# Patient Record
Sex: Female | Born: 1950 | Race: Asian | Hispanic: No | Marital: Married | Smoking: Never smoker
Health system: Southern US, Community
[De-identification: ages and names within clinical notes are randomized; demographics above are authoritative.]

## PROBLEM LIST (undated history)

## (undated) DIAGNOSIS — E119 Type 2 diabetes mellitus without complications: Secondary | ICD-10-CM

---

## 2016-05-22 ENCOUNTER — Emergency Department (HOSPITAL_COMMUNITY)
Admission: EM | Admit: 2016-05-22 | Discharge: 2016-05-22 | Disposition: A | Discharge status: Home / Self Care | Payer: BLUE CROSS/BLUE SHIELD | Attending: Emergency Medicine | Admitting: Emergency Medicine

## 2016-05-22 ENCOUNTER — Emergency Department (HOSPITAL_COMMUNITY): Payer: BLUE CROSS/BLUE SHIELD

## 2016-05-22 ENCOUNTER — Encounter (HOSPITAL_COMMUNITY): Payer: Self-pay | Admitting: Emergency Medicine

## 2016-05-22 DIAGNOSIS — R102 Pelvic and perineal pain: Secondary | ICD-10-CM

## 2016-05-22 DIAGNOSIS — N390 Urinary tract infection, site not specified: Secondary | ICD-10-CM | POA: Diagnosis not present

## 2016-05-22 DIAGNOSIS — R1030 Lower abdominal pain, unspecified: Secondary | ICD-10-CM | POA: Diagnosis present

## 2016-05-22 LAB — CBC WITH DIFFERENTIAL/PLATELET
BASOS ABS: 0 10*3/uL (ref 0.0–0.1)
BASOS PCT: 0 %
EOS ABS: 0 10*3/uL (ref 0.0–0.7)
Eosinophils Relative: 0 %
HEMATOCRIT: 38.3 % (ref 36.0–46.0)
Hemoglobin: 12.5 g/dL (ref 12.0–15.0)
Lymphocytes Relative: 13 %
Lymphs Abs: 1.1 10*3/uL (ref 0.7–4.0)
MCH: 27 pg (ref 26.0–34.0)
MCHC: 32.6 g/dL (ref 30.0–36.0)
MCV: 82.7 fL (ref 78.0–100.0)
MONOS PCT: 5 %
Monocytes Absolute: 0.4 10*3/uL (ref 0.1–1.0)
Neutro Abs: 6.8 10*3/uL (ref 1.7–7.7)
Neutrophils Relative %: 82 %
Platelets: 261 10*3/uL (ref 150–400)
RBC: 4.63 MIL/uL (ref 3.87–5.11)
RDW: 13.3 % (ref 11.5–15.5)
WBC: 8.4 10*3/uL (ref 4.0–10.5)

## 2016-05-22 LAB — URINALYSIS, ROUTINE W REFLEX MICROSCOPIC
BILIRUBIN URINE: NEGATIVE
Glucose, UA: NEGATIVE mg/dL
Ketones, ur: NEGATIVE mg/dL
NITRITE: NEGATIVE
Protein, ur: 100 mg/dL — AB
SPECIFIC GRAVITY, URINE: 1.013 (ref 1.005–1.030)
pH: 5 (ref 5.0–8.0)

## 2016-05-22 LAB — COMPREHENSIVE METABOLIC PANEL
ALT: 41 U/L (ref 14–54)
AST: 56 U/L — ABNORMAL HIGH (ref 15–41)
Albumin: 3.3 g/dL — ABNORMAL LOW (ref 3.5–5.0)
Alkaline Phosphatase: 87 U/L (ref 38–126)
Anion gap: 10 (ref 5–15)
BILIRUBIN TOTAL: 0.5 mg/dL (ref 0.3–1.2)
BUN: 5 mg/dL — AB (ref 6–20)
CHLORIDE: 99 mmol/L — AB (ref 101–111)
CO2: 25 mmol/L (ref 22–32)
CREATININE: 0.74 mg/dL (ref 0.44–1.00)
Calcium: 8.9 mg/dL (ref 8.9–10.3)
Glucose, Bld: 211 mg/dL — ABNORMAL HIGH (ref 65–99)
Potassium: 3.4 mmol/L — ABNORMAL LOW (ref 3.5–5.1)
Sodium: 134 mmol/L — ABNORMAL LOW (ref 135–145)
TOTAL PROTEIN: 6.7 g/dL (ref 6.5–8.1)

## 2016-05-22 LAB — I-STAT CG4 LACTIC ACID, ED
LACTIC ACID, VENOUS: 2.37 mmol/L — AB (ref 0.5–1.9)
LACTIC ACID, VENOUS: 1.4 mmol/L (ref 0.5–1.9)

## 2016-05-22 MED ORDER — FENTANYL CITRATE (PF) 100 MCG/2ML IJ SOLN
50.0000 ug | Freq: Once | INTRAMUSCULAR | Status: AC
  Administered 2016-05-22: 50 ug via INTRAVENOUS
  Filled 2016-05-22: qty 2

## 2016-05-22 MED ORDER — ONDANSETRON HCL 4 MG/2ML IJ SOLN
4.0000 mg | Freq: Once | INTRAMUSCULAR | Status: AC
  Administered 2016-05-22: 4 mg via INTRAVENOUS
  Filled 2016-05-22: qty 2

## 2016-05-22 MED ORDER — ACETAMINOPHEN 500 MG PO TABS
1000.0000 mg | ORAL_TABLET | Freq: Once | ORAL | Status: AC
  Administered 2016-05-22: 1000 mg via ORAL
  Filled 2016-05-22: qty 2

## 2016-05-22 MED ORDER — IOPAMIDOL (ISOVUE-300) INJECTION 61%
INTRAVENOUS | Status: AC
  Administered 2016-05-22: 80 mL
  Filled 2016-05-22: qty 100

## 2016-05-22 MED ORDER — IOPAMIDOL (ISOVUE-300) INJECTION 61%
INTRAVENOUS | Status: AC
  Administered 2016-05-22: 20:00:00
  Filled 2016-05-22: qty 50

## 2016-05-22 MED ORDER — CEFTRIAXONE SODIUM 1 G IJ SOLR
1.0000 g | Freq: Once | INTRAMUSCULAR | Status: AC
  Administered 2016-05-22: 1 g via INTRAVENOUS
  Filled 2016-05-22: qty 10

## 2016-05-22 MED ORDER — ONDANSETRON 4 MG PO TBDP: 4.0000 mg | ORAL_TABLET | Freq: Three times a day (TID) | ORAL | 0 refills | Status: AC | PRN

## 2016-05-22 MED ORDER — CEPHALEXIN 500 MG PO CAPS: 500.0000 mg | ORAL_CAPSULE | Freq: Three times a day (TID) | ORAL | 0 refills | Status: AC

## 2016-05-22 MED ORDER — SODIUM CHLORIDE 0.9 % IV BOLUS (SEPSIS)
1000.0000 mL | Freq: Once | INTRAVENOUS | Status: AC
  Administered 2016-05-22: 1000 mL via INTRAVENOUS

## 2016-05-22 NOTE — ED Provider Notes (Signed)
[facility]-EMERGENCY DEPT Provider Note   CSN: [id number] Arrival date & time: 05/22/16  1748     History   Chief Complaint Chief Complaint  Patient presents with  . Abdominal Pain  . Fever    HPI Amy Meadows is a 66 y.o. female.  The history is provided by the patient. The history is limited by a language barrier. Language interpreter used: son able to interpret   Abdominal Pain   This is a new problem. Episode onset: 1 week. The problem occurs constantly. The problem has been gradually worsening. The pain is located in the suprapubic region. The pain is moderate. Associated symptoms include fever, diarrhea, nausea, vomiting, dysuria and frequency. Pertinent negatives include hematochezia and melena. Nothing aggravates the symptoms. Nothing relieves the symptoms.  Fever   Associated symptoms include diarrhea and vomiting.    History reviewed. No pertinent past medical history.  There are no active problems to display for this patient.   History reviewed. No pertinent surgical history.  OB History    No data available       Home Medications    Prior to Admission medications   Medication Sig Start Date End Date Taking? Authorizing Provider  phenazopyridine (PYRIDIUM) 97 MG tablet Take 97 mg by mouth 3 (three) times daily as needed for pain.   Yes Historical Provider, MD  cephALEXin (KEFLEX) 500 MG capsule Take 1 capsule (500 mg total) by mouth 3 (three) times daily. 05/22/16 06/01/16  Nira Conn, MD  ondansetron (ZOFRAN ODT) 4 MG disintegrating tablet Take 1 tablet (4 mg total) by mouth every 8 (eight) hours as needed for nausea or vomiting. 05/22/16 05/25/16  Nira Conn, MD    Family History No family history on file.  Social History Social History  Substance Use Topics  . Smoking status: Not on file  . Smokeless tobacco: Not on file  . Alcohol use Not on file     Allergies   Patient has no known allergies.   Review of Systems Review of Systems    Constitutional: Positive for fever.  Gastrointestinal: Positive for abdominal pain, diarrhea, nausea and vomiting. Negative for hematochezia and melena.  Genitourinary: Positive for dysuria and frequency.   Ten systems are reviewed and are negative for acute change except as noted in the HPI   Physical Exam Updated Vital Signs BP 127/70 (BP Location: Right Arm)   Pulse (!) 110   Temp 99.4 F (37.4 C) (Oral)   Resp 17   SpO2 98%   Physical Exam  Constitutional: She is oriented to person, place, and time. She appears well-developed and well-nourished. No distress.  HENT:  Head: Normocephalic and atraumatic.  Nose: Nose normal.  Eyes: Conjunctivae and EOM are normal. Pupils are equal, round, and reactive to light. Right eye exhibits no discharge. Left eye exhibits no discharge. No scleral icterus.  Neck: Normal range of motion. Neck supple.  Cardiovascular: Normal rate and regular rhythm.  Exam reveals no gallop and no friction rub.   No murmur heard. Pulmonary/Chest: Effort normal and breath sounds normal. No stridor. No respiratory distress. She has no rales.  Abdominal: Soft. She exhibits no distension. There is tenderness in the suprapubic area and left lower quadrant. There is no rigidity, no rebound, no guarding and no CVA tenderness.  Musculoskeletal: She exhibits no edema or tenderness.  Neurological: She is alert and oriented to person, place, and time.  Skin: Skin is warm and dry. No rash noted. She is not diaphoretic. No  erythema.  Psychiatric: She has a normal mood and affect.  Vitals reviewed.    ED Treatments / Results  Labs (all labs ordered are listed, but only abnormal results are displayed) Labs Reviewed  COMPREHENSIVE METABOLIC PANEL - Abnormal; Notable for the following:       Result Value   Sodium 134 (*)    Potassium 3.4 (*)    Chloride 99 (*)    Glucose, Bld 211 (*)    BUN 5 (*)    Albumin 3.3 (*)    AST 56 (*)    All other components within  normal limits  URINALYSIS, ROUTINE W REFLEX MICROSCOPIC - Abnormal; Notable for the following:    Color, Urine AMBER (*)    APPearance TURBID (*)    Hgb urine dipstick MODERATE (*)    Protein, ur 100 (*)    Leukocytes, UA LARGE (*)    Bacteria, UA FEW (*)    Squamous Epithelial / LPF 6-30 (*)    Non Squamous Epithelial 0-5 (*)    All other components within normal limits  I-STAT CG4 LACTIC ACID, ED - Abnormal; Notable for the following:    Lactic Acid, Venous 2.37 (*)    All other components within normal limits  URINE CULTURE  CBC WITH DIFFERENTIAL/PLATELET  I-STAT CG4 LACTIC ACID, ED    EKG  EKG Interpretation None       Radiology Ct Abdomen Pelvis W Contrast  Result Date: 05/22/2016 CLINICAL DATA:  66 year old female with abdominal pain, nausea vomiting and diarrhea. EXAM: CT ABDOMEN AND PELVIS WITH CONTRAST TECHNIQUE: Multidetector CT imaging of the abdomen and pelvis was performed using the standard protocol following bolus administration of intravenous contrast. CONTRAST:  80mL ISOVUE-300 IOPAMIDOL (ISOVUE-300) INJECTION 61% COMPARISON:  None. FINDINGS: Lower chest: There bibasilar linear atelectasis. Coronary vascular calcification involving the LAD noted. No intra-abdominal free air or free fluid. Hepatobiliary: Apparent mild fatty infiltration of the liver. No intrahepatic biliary ductal dilatation. The gallbladder is unremarkable. Pancreas: Unremarkable. No pancreatic ductal dilatation or surrounding inflammatory changes. Spleen: Normal in size without focal abnormality. Adrenals/Urinary Tract: The adrenal glands appear unremarkable. There is mild haziness of the periureteric fat at the renal hila as well as mild haziness of the bladder wall. Correlation with urinalysis recommended to exclude UTI. There is no hydronephrosis on either side. There is symmetric uptake and excretion of contrast by kidneys bilaterally. 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 There is  no evidence of bowel obstruction or active inflammation. Normal appendix. Vascular/Lymphatic: Moderate aortoiliac atherosclerotic disease. No aneurysmal dilatation or evidence of dissection. The celiac axis, SMA, and renal arteries appear patent. The SMV, splenic vein, and main portal vein are patent. No portal venous gas identified. There is no adenopathy. Reproductive: The uterus is anteverted and grossly unremarkable. There is minimal prominence of the left ovary without a discrete mass. Ultrasound may provide better evaluation of the pelvic structures if clinically indicated. The right ovary is unremarkable. Other: Small fat containing umbilical hernia. Musculoskeletal: There is mild degenerative changes of the spine with multilevel disc desiccation with vacuum phenomena. No acute fracture. IMPRESSION: 1. No evidence of bowel obstruction or active inflammation. Normal appendix. 2. Mild haziness of the bladder wall and periureteric fat. Correlation with urinalysis recommended to exclude UTI. 3. A small duodenal diverticulum. Electronically Signed   By: Elgie CollardArash  Radparvar M.D.   On: 05/22/2016 23:31    Procedures Procedures (including critical care time)  Medications Ordered in ED Medications  acetaminophen (TYLENOL) tablet  1,000 mg (1,000 mg Oral Given 05/22/16 1951)  fentaNYL (SUBLIMAZE) injection 50 mcg (50 mcg Intravenous Given 05/22/16 1951)  sodium chloride 0.9 % bolus 1,000 mL (0 mLs Intravenous Stopped 05/22/16 2037)  ondansetron (ZOFRAN) injection 4 mg (4 mg Intravenous Given 05/22/16 1951)  iopamidol (ISOVUE-300) 61 % injection (  Contrast Given 05/22/16 2015)  cefTRIAXone (ROCEPHIN) 1 g in dextrose 5 % 50 mL IVPB (0 g Intravenous Stopped 05/22/16 2145)  iopamidol (ISOVUE-300) 61 % injection (80 mLs  Contrast Given 05/22/16 2230)     Initial Impression / Assessment and Plan / ED Course  I have reviewed the triage vital signs and the nursing notes.  Pertinent labs & imaging results that were  available during my care of the patient were reviewed by me and considered in my medical decision making (see chart for details).     Urinary definitive for urinary tract infection given the contamination with squamous cells and lack of nitrite. Given the patient's lower abdominal and left lower quadrant pain CT was obtained to rule out diverticulitis. CT did not reveal GI etiology however did reveal bladder wall thickening which is consistent with urinary tract infection. Patient was provided with first dose of antibiotics in the emergency department. She was able to tolerate by mouth in the ED. Feel she is safe for discharge with strict return precautions.  Final Clinical Impressions(s) / ED Diagnoses   Final diagnoses:  Lower urinary tract infectious disease  Suprapubic pain   Disposition: Discharge  Condition: Good  I have discussed the results, Dx and Tx plan with the patient and familywho expressed understanding and agree(s) with the plan. Discharge instructions discussed at great length. The patient and family were given strict return precautions who verbalized understanding of the instructions. No further questions at time of discharge.    New Prescriptions   CEPHALEXIN (KEFLEX) 500 MG CAPSULE    Take 1 capsule (500 mg total) by mouth 3 (three) times daily.   ONDANSETRON (ZOFRAN ODT) 4 MG DISINTEGRATING TABLET    Take 1 tablet (4 mg total) by mouth every 8 (eight) hours as needed for nausea or vomiting.    Follow Up: Primary care provider  Schedule an appointment as soon as possible for a visit  in 5-7 days, If symptoms do not improve or  worsen      Nira Conn, MD 05/22/16 2337

## 2016-05-22 NOTE — ED Triage Notes (Signed)
edp  Notified of lactic acid

## 2016-05-22 NOTE — ED Triage Notes (Signed)
Pt reports lower abd pain with burning when she urinates for the last 3 days. Pt also reports chills since last night.

## 2016-05-22 NOTE — ED Notes (Signed)
ED Provider at bedside. 

## 2016-05-22 NOTE — ED Triage Notes (Signed)
Lactic acid elevated a2.37

## 2016-05-25 LAB — URINE CULTURE: Culture: >=100000 — AB

## 2016-05-26 ENCOUNTER — Telehealth: Payer: Self-pay | Admitting: Emergency Medicine

## 2016-05-26 NOTE — Telephone Encounter (Signed)
Post ED Visit - Positive Culture Follow-up  Culture report reviewed by antimicrobial stewardship pharmacist:  []  Enzo BiNathan Batchelder, Pharm.D. []  Celedonio MiyamotoJeremy Frens, Pharm.D., BCPS AQ-ID []  Garvin FilaMike Maccia, Pharm.D., BCPS []  Georgina PillionElizabeth Martin, Pharm.D., BCPS []  [place]burgMinh Pham, [address] Rainbow BoulevardPharm.D., BCPS, AAHIVP []  Estella HuskMichelle Turner, Pharm.D., BCPS, AAHIVP []  Lysle Pearlachel Rumbarger, PharmD, BCPS []  Casilda Carlsaylor Stone, PharmD, BCPS []  Pollyann SamplesAndy Johnston, PharmD, BCPS  Positive urine culture Treated with cephalexin, organism sensitive to the same and no further patient follow-up is required at this time.  Berle MullMiller, Alquan Morrish 05/26/2016, 12:28 PM

## 2017-07-25 ENCOUNTER — Encounter (HOSPITAL_COMMUNITY): Payer: Self-pay | Admitting: *Deleted

## 2017-07-25 ENCOUNTER — Other Ambulatory Visit: Payer: Self-pay

## 2017-07-25 ENCOUNTER — Inpatient Hospital Stay (HOSPITAL_COMMUNITY)
Admission: EM | Admit: 2017-07-25 | Discharge: 2017-07-27 | DRG: 392 | Disposition: A | Discharge status: Home / Self Care | Payer: BLUE CROSS/BLUE SHIELD | Attending: Internal Medicine | Admitting: Internal Medicine

## 2017-07-25 ENCOUNTER — Emergency Department (HOSPITAL_COMMUNITY): Payer: BLUE CROSS/BLUE SHIELD

## 2017-07-25 DIAGNOSIS — K529 Noninfective gastroenteritis and colitis, unspecified: Secondary | ICD-10-CM

## 2017-07-25 DIAGNOSIS — E872 Acidosis: Secondary | ICD-10-CM | POA: Diagnosis not present

## 2017-07-25 DIAGNOSIS — K760 Fatty (change of) liver, not elsewhere classified: Secondary | ICD-10-CM | POA: Diagnosis not present

## 2017-07-25 DIAGNOSIS — R109 Unspecified abdominal pain: Secondary | ICD-10-CM | POA: Diagnosis not present

## 2017-07-25 DIAGNOSIS — I959 Hypotension, unspecified: Secondary | ICD-10-CM | POA: Diagnosis present

## 2017-07-25 DIAGNOSIS — N12 Tubulo-interstitial nephritis, not specified as acute or chronic: Secondary | ICD-10-CM | POA: Diagnosis not present

## 2017-07-25 DIAGNOSIS — R197 Diarrhea, unspecified: Secondary | ICD-10-CM

## 2017-07-25 DIAGNOSIS — E860 Dehydration: Secondary | ICD-10-CM | POA: Diagnosis not present

## 2017-07-25 DIAGNOSIS — E861 Hypovolemia: Secondary | ICD-10-CM | POA: Diagnosis not present

## 2017-07-25 DIAGNOSIS — A09 Infectious gastroenteritis and colitis, unspecified: Secondary | ICD-10-CM | POA: Diagnosis not present

## 2017-07-25 DIAGNOSIS — E119 Type 2 diabetes mellitus without complications: Secondary | ICD-10-CM

## 2017-07-25 DIAGNOSIS — I9589 Other hypotension: Secondary | ICD-10-CM | POA: Diagnosis not present

## 2017-07-25 HISTORY — DX: Type 2 diabetes mellitus without complications: E11.9

## 2017-07-25 LAB — URINALYSIS, ROUTINE W REFLEX MICROSCOPIC
GLUCOSE, UA: NEGATIVE mg/dL
Ketones, ur: NEGATIVE mg/dL
NITRITE: NEGATIVE
PH: 5 (ref 5.0–8.0)
PROTEIN: 30 mg/dL — AB
Specific Gravity, Urine: 1.021 (ref 1.005–1.030)

## 2017-07-25 LAB — COMPREHENSIVE METABOLIC PANEL
ALBUMIN: 4 g/dL (ref 3.5–5.0)
ALT: 24 U/L (ref 14–54)
AST: 27 U/L (ref 15–41)
Alkaline Phosphatase: 76 U/L (ref 38–126)
Anion gap: 16 — ABNORMAL HIGH (ref 5–15)
BILIRUBIN TOTAL: 0.7 mg/dL (ref 0.3–1.2)
BUN: 19 mg/dL (ref 6–20)
CALCIUM: 9.4 mg/dL (ref 8.9–10.3)
CO2: 19 mmol/L — ABNORMAL LOW (ref 22–32)
Chloride: 104 mmol/L (ref 101–111)
Creatinine, Ser: 0.99 mg/dL (ref 0.44–1.00)
GFR calc Af Amer: >60 mL/min (ref >60)
GFR, EST NON AFRICAN AMERICAN: 59 mL/min — AB (ref >60)
Glucose, Bld: 169 mg/dL — ABNORMAL HIGH (ref 65–99)
POTASSIUM: 4.2 mmol/L (ref 3.5–5.1)
SODIUM: 139 mmol/L (ref 135–145)
TOTAL PROTEIN: 7.6 g/dL (ref 6.5–8.1)

## 2017-07-25 LAB — CBC
HEMATOCRIT: 50 % — AB (ref 36.0–46.0)
Hemoglobin: 15.6 g/dL — ABNORMAL HIGH (ref 12.0–15.0)
MCH: 26.7 pg (ref 26.0–34.0)
MCHC: 31.2 g/dL (ref 30.0–36.0)
MCV: 85.6 fL (ref 78.0–100.0)
Platelets: 314 10*3/uL (ref 150–400)
RBC: 5.84 MIL/uL — ABNORMAL HIGH (ref 3.87–5.11)
RDW: 14.2 % (ref 11.5–15.5)
WBC: 15.9 10*3/uL — AB (ref 4.0–10.5)

## 2017-07-25 LAB — LIPASE, BLOOD: Lipase: 30 U/L (ref 11–51)

## 2017-07-25 MED ORDER — SODIUM CHLORIDE 0.9 % IV SOLN
1.0000 g | Freq: Once | INTRAVENOUS | Status: AC
  Administered 2017-07-25: 1 g via INTRAVENOUS
  Filled 2017-07-25: qty 10

## 2017-07-25 MED ORDER — ONDANSETRON 4 MG PO TBDP
4.0000 mg | ORAL_TABLET | Freq: Once | ORAL | Status: AC | PRN
  Administered 2017-07-25: 4 mg via ORAL
  Filled 2017-07-25: qty 1

## 2017-07-25 MED ORDER — IOHEXOL 300 MG/ML  SOLN
100.0000 mL | Freq: Once | INTRAMUSCULAR | Status: AC | PRN
  Administered 2017-07-25: 100 mL via INTRAVENOUS

## 2017-07-25 MED ORDER — SODIUM CHLORIDE 0.9 % IV BOLUS
1000.0000 mL | Freq: Once | INTRAVENOUS | Status: AC
  Administered 2017-07-25: 1000 mL via INTRAVENOUS

## 2017-07-25 NOTE — ED Provider Notes (Signed)
[facility] EMERGENCY DEPARTMENT Provider Note   CSN: [id number] Arrival date & time: 07/25/17  1609     History   Chief Complaint Chief Complaint  Patient presents with  . Emesis  . Diarrhea    HPI Amy Meadows is a 67 y.o. female.  The history is provided by the patient. No language interpreter was used.  Emesis   This is a new problem. The current episode started yesterday. The problem has not changed since onset.There has been no fever. Associated symptoms include abdominal pain and diarrhea.  Diarrhea   Associated symptoms include abdominal pain and vomiting.  Abdominal Pain   The current episode started yesterday. The problem occurs constantly. The problem has been gradually worsening. The pain is located in the suprapubic region. Associated symptoms include diarrhea and vomiting. Nothing relieves the symptoms.  Pt has had multiple episodes of diarrhea starting yesterday.  Pt complains of lower abdominal pain.  Pt complains of pain with urination   History reviewed. No pertinent past medical history.  There are no active problems to display for this patient.   History reviewed. No pertinent surgical history.   OB History   None      Home Medications    Prior to Admission medications   Not on File    Family History History reviewed. No pertinent family history.  Social History Social History   Tobacco Use  . Smoking status: Not on file  Substance Use Topics  . Alcohol use: Not on file  . Drug use: Not on file     Allergies   Patient has no known allergies.   Review of Systems Review of Systems  Gastrointestinal: Positive for abdominal pain, diarrhea and vomiting.  All other systems reviewed and are negative.    Physical Exam Updated Vital Signs BP 101/65   Pulse 86   Temp 98 F (36.7 C) (Oral)   Resp (!) 32   SpO2 96%   Physical Exam  Constitutional: She appears well-developed and well-nourished.  HENT:  Head:  Normocephalic.  Eyes: Pupils are equal, round, and reactive to light.  Cardiovascular: Normal rate and regular rhythm.  Pulmonary/Chest: Effort normal and breath sounds normal.  Abdominal: Soft. There is tenderness.  Musculoskeletal: Normal range of motion.  Neurological: She is alert.  Skin: Skin is warm.  Psychiatric: She has a normal mood and affect.  Nursing note and vitals reviewed.    ED Treatments / Results  Labs (all labs ordered are listed, but only abnormal results are displayed) Labs Reviewed  COMPREHENSIVE METABOLIC PANEL - Abnormal; Notable for the following components:      Result Value   CO2 19 (*)    Glucose, Bld 169 (*)    GFR calc non Af Amer 59 (*)    Anion gap 16 (*)    All other components within normal limits  CBC - Abnormal; Notable for the following components:   WBC 15.9 (*)    RBC 5.84 (*)    Hemoglobin 15.6 (*)    HCT 50.0 (*)    All other components within normal limits  URINALYSIS, ROUTINE W REFLEX MICROSCOPIC - Abnormal; Notable for the following components:   Color, Urine AMBER (*)    APPearance CLOUDY (*)    Hgb urine dipstick SMALL (*)    Bilirubin Urine SMALL (*)    Protein, ur 30 (*)    Leukocytes, UA MODERATE (*)    Bacteria, UA MANY (*)    All other  components within normal limits  LIPASE, BLOOD    EKG None  Radiology No results found.  Procedures Procedures (including critical care time)  Medications Ordered in ED Medications  ondansetron (ZOFRAN-ODT) disintegrating tablet 4 mg (4 mg Oral Given 07/25/17 1750)  sodium chloride 0.9 % bolus 1,000 mL (0 mLs Intravenous Stopped 07/25/17 2225)  cefTRIAXone (ROCEPHIN) 1 g in sodium chloride 0.9 % 100 mL IVPB (1 g Intravenous New Bag/Given 07/25/17 2220)  iohexol (OMNIPAQUE) 300 MG/ML solution 100 mL (100 mLs Intravenous Contrast Given 07/25/17 2319)     Initial Impression / Assessment and Plan / ED Course  I have reviewed the triage vital signs and the nursing notes.  Pertinent  labs & imaging results that were available during my care of the patient were reviewed by me and considered in my medical decision making (see chart for details).  Clinical Course as of Jul 26 40  Tue Jul 26, 2017  0033 Chloride: 104 [LS]    Clinical Course User Index [LS] Elson Areas, [place]   Ct scan shows colitis.  No perforation.   MDM  Pt given Iv fluids x 2 liters. Rocephin 1 gram Iv for pyelonephritis.  I spoke to Hospitalist Dr. Joneen Roach who will admit  Final Clinical Impressions(s) / ED Diagnoses   Final diagnoses:  Pyelonephritis  Diarrhea, unspecified type  Colitis    ED Discharge Orders    None       Osie Cheeks 07/26/17 0117    Donnetta Hutching, MD 07/28/17 1003

## 2017-07-25 NOTE — ED Notes (Signed)
Pt speaks [email or web address].language

## 2017-07-25 NOTE — ED Notes (Signed)
Patient transported to CT 

## 2017-07-25 NOTE — ED Notes (Signed)
Due to results and recent vitals, will upgrade patient's acuity and prioritize rooming

## 2017-07-25 NOTE — ED Triage Notes (Signed)
Pt reports recent travel to [place]. Having abd pain with n/v/d since this am.

## 2017-07-26 ENCOUNTER — Encounter (HOSPITAL_COMMUNITY): Payer: Self-pay | Admitting: Family Medicine

## 2017-07-26 DIAGNOSIS — E119 Type 2 diabetes mellitus without complications: Secondary | ICD-10-CM

## 2017-07-26 DIAGNOSIS — A09 Infectious gastroenteritis and colitis, unspecified: Secondary | ICD-10-CM | POA: Diagnosis present

## 2017-07-26 DIAGNOSIS — I9589 Other hypotension: Secondary | ICD-10-CM

## 2017-07-26 DIAGNOSIS — N12 Tubulo-interstitial nephritis, not specified as acute or chronic: Secondary | ICD-10-CM | POA: Diagnosis present

## 2017-07-26 DIAGNOSIS — I959 Hypotension, unspecified: Secondary | ICD-10-CM | POA: Diagnosis present

## 2017-07-26 DIAGNOSIS — E861 Hypovolemia: Secondary | ICD-10-CM

## 2017-07-26 DIAGNOSIS — E860 Dehydration: Secondary | ICD-10-CM | POA: Diagnosis present

## 2017-07-26 DIAGNOSIS — K529 Noninfective gastroenteritis and colitis, unspecified: Secondary | ICD-10-CM | POA: Diagnosis not present

## 2017-07-26 DIAGNOSIS — K760 Fatty (change of) liver, not elsewhere classified: Secondary | ICD-10-CM | POA: Diagnosis present

## 2017-07-26 DIAGNOSIS — E872 Acidosis: Secondary | ICD-10-CM | POA: Diagnosis present

## 2017-07-26 HISTORY — DX: Type 2 diabetes mellitus without complications: E11.9

## 2017-07-26 LAB — CBC WITH DIFFERENTIAL/PLATELET
ABS IMMATURE GRANULOCYTES: 0 10*3/uL (ref 0.0–0.1)
BASOS ABS: 0 10*3/uL (ref 0.0–0.1)
Basophils Relative: 0 %
Eosinophils Absolute: 0.1 10*3/uL (ref 0.0–0.7)
Eosinophils Relative: 1 %
HCT: 37.9 % (ref 36.0–46.0)
Hemoglobin: 12.2 g/dL (ref 12.0–15.0)
Immature Granulocytes: 0 %
Lymphocytes Relative: 16 %
Lymphs Abs: 1.9 10*3/uL (ref 0.7–4.0)
MCH: 27 pg (ref 26.0–34.0)
MCHC: 32.2 g/dL (ref 30.0–36.0)
MCV: 83.8 fL (ref 78.0–100.0)
MONO ABS: 0.7 10*3/uL (ref 0.1–1.0)
Monocytes Relative: 5 %
NEUTROS ABS: 9.3 10*3/uL — AB (ref 1.7–7.7)
Neutrophils Relative %: 78 %
PLATELETS: 256 10*3/uL (ref 150–400)
RBC: 4.52 MIL/uL (ref 3.87–5.11)
RDW: 14.1 % (ref 11.5–15.5)
WBC: 12 10*3/uL — ABNORMAL HIGH (ref 4.0–10.5)

## 2017-07-26 LAB — GASTROINTESTINAL PANEL BY PCR, STOOL (REPLACES STOOL CULTURE)
ADENOVIRUS F40/41: NOT DETECTED
ASTROVIRUS: NOT DETECTED
CAMPYLOBACTER SPECIES: NOT DETECTED
CYCLOSPORA CAYETANENSIS: NOT DETECTED
Cryptosporidium: NOT DETECTED
ENTAMOEBA HISTOLYTICA: NOT DETECTED
ENTEROPATHOGENIC E COLI (EPEC): DETECTED — AB
ENTEROTOXIGENIC E COLI (ETEC): NOT DETECTED
Enteroaggregative E coli (EAEC): DETECTED — AB
Giardia lamblia: NOT DETECTED
Norovirus GI/GII: DETECTED — AB
Plesimonas shigelloides: NOT DETECTED
Rotavirus A: NOT DETECTED
Salmonella species: NOT DETECTED
Sapovirus (I, II, IV, and V): NOT DETECTED
Shiga like toxin producing E coli (STEC): NOT DETECTED
Shigella/Enteroinvasive E coli (EIEC): NOT DETECTED
VIBRIO CHOLERAE: NOT DETECTED
VIBRIO SPECIES: NOT DETECTED
Yersinia enterocolitica: NOT DETECTED

## 2017-07-26 LAB — BASIC METABOLIC PANEL
Anion gap: 9 (ref 5–15)
BUN: 13 mg/dL (ref 6–20)
CALCIUM: 7.3 mg/dL — AB (ref 8.9–10.3)
CO2: 18 mmol/L — ABNORMAL LOW (ref 22–32)
CREATININE: 0.8 mg/dL (ref 0.44–1.00)
Chloride: 113 mmol/L — ABNORMAL HIGH (ref 101–111)
GFR calc Af Amer: >60 mL/min (ref >60)
GLUCOSE: 111 mg/dL — AB (ref 65–99)
Potassium: 3.8 mmol/L (ref 3.5–5.1)
SODIUM: 140 mmol/L (ref 135–145)

## 2017-07-26 LAB — CBG MONITORING, ED
Glucose-Capillary: 110 mg/dL — ABNORMAL HIGH (ref 65–99)
Glucose-Capillary: 113 mg/dL — ABNORMAL HIGH (ref 65–99)
Glucose-Capillary: 95 mg/dL (ref 65–99)
Glucose-Capillary: 80 mg/dL (ref 65–99)
Glucose-Capillary: 99 mg/dL (ref 65–99)

## 2017-07-26 LAB — HIV ANTIBODY (ROUTINE TESTING W REFLEX): HIV SCREEN 4TH GENERATION: NONREACTIVE

## 2017-07-26 LAB — C DIFFICILE QUICK SCREEN W PCR REFLEX
C DIFFICLE (CDIFF) ANTIGEN: NEGATIVE
C Diff interpretation: NOT DETECTED
C Diff toxin: NEGATIVE

## 2017-07-26 LAB — GLUCOSE, CAPILLARY: GLUCOSE-CAPILLARY: 114 mg/dL — AB (ref 65–99)

## 2017-07-26 MED ORDER — ACETAMINOPHEN 650 MG RE SUPP: 650.0000 mg | Freq: Four times a day (QID) | RECTAL | Status: DC | PRN

## 2017-07-26 MED ORDER — SODIUM CHLORIDE 0.9 % IV SOLN
INTRAVENOUS | Status: AC
  Administered 2017-07-26: 02:00:00 via INTRAVENOUS

## 2017-07-26 MED ORDER — INSULIN ASPART 100 UNIT/ML ~~LOC~~ SOLN: 0.0000 [IU] | SUBCUTANEOUS | Status: DC

## 2017-07-26 MED ORDER — SODIUM CHLORIDE 0.9 % IV BOLUS
1000.0000 mL | Freq: Once | INTRAVENOUS | Status: AC
  Administered 2017-07-26: 1000 mL via INTRAVENOUS

## 2017-07-26 MED ORDER — PROMETHAZINE HCL 25 MG/ML IJ SOLN: 12.5000 mg | Freq: Four times a day (QID) | INTRAMUSCULAR | Status: DC | PRN

## 2017-07-26 MED ORDER — FENTANYL CITRATE (PF) 100 MCG/2ML IJ SOLN
25.0000 ug | INTRAMUSCULAR | Status: DC | PRN
  Administered 2017-07-26: 25 ug via INTRAVENOUS
  Filled 2017-07-26: qty 2

## 2017-07-26 MED ORDER — SODIUM CHLORIDE 0.9 % IV SOLN
500.0000 mg | INTRAVENOUS | Status: DC
  Administered 2017-07-26 – 2017-07-27 (×2): 500 mg via INTRAVENOUS
  Filled 2017-07-26 (×2): qty 500

## 2017-07-26 MED ORDER — ACETAMINOPHEN 325 MG PO TABS
650.0000 mg | ORAL_TABLET | Freq: Four times a day (QID) | ORAL | Status: DC | PRN
Start: 2017-07-26 — End: 2017-07-27

## 2017-07-26 MED ORDER — ONDANSETRON HCL 4 MG PO TABS: 4.0000 mg | ORAL_TABLET | Freq: Four times a day (QID) | ORAL | Status: DC | PRN

## 2017-07-26 MED ORDER — FAMOTIDINE IN NACL 20-0.9 MG/50ML-% IV SOLN
20.0000 mg | Freq: Two times a day (BID) | INTRAVENOUS | Status: DC
  Administered 2017-07-26 – 2017-07-27 (×4): 20 mg via INTRAVENOUS
  Filled 2017-07-26 (×4): qty 50

## 2017-07-26 MED ORDER — ONDANSETRON HCL 4 MG/2ML IJ SOLN
4.0000 mg | Freq: Four times a day (QID) | INTRAMUSCULAR | Status: DC | PRN
  Administered 2017-07-26: 4 mg via INTRAVENOUS
  Filled 2017-07-26: qty 2

## 2017-07-26 NOTE — ED Notes (Signed)
Provider at beside, pt received 1000 NS bolus with no change in BP, Dr. Antionette Char aware.

## 2017-07-26 NOTE — ED Notes (Signed)
Kitchen called to get tray sent to pt room upstairs

## 2017-07-26 NOTE — Progress Notes (Signed)
Patient seen and examined at bedside with video interpreter, patient admitted after midnight, please see earlier detailed admission note by Briscoe Deutscher, MD. Briefly, patient presented with abdominal pain, nausea, vomiting, diarrhea.  She is recently returned from [place] earlier this month.  C. difficile panel negative.  GI panel is pending.  Patient's symptoms of nausea, vomiting, diarrhea have improved this morning.  Tolerated breakfast.  Blood pressure has improved to 90s systolic.  We will continue to watch until this afternoon, and if still stable, will transfer to telemetry stepdown.  Follow-up on GI panel.  Continue azithromycin for antibiotic coverage of infection.  Patient communicates in [place] only, no specific dialect.   Jacquelin Hawking, MD [facility] 07/26/2017, 9:09 AM Pager: [phone]

## 2017-07-26 NOTE — ED Notes (Signed)
Full liquid diet lunch tray ordered.

## 2017-07-26 NOTE — H&P (Signed)
History and Physical    Amy Meadows ZOX:[id number] DOB: 1950/10/01 DOA: 07/25/2017  PCP: Patient, No Pcp Per   Patient coming from: Home  Chief Complaint: Abd pain, N/V/D   HPI: Amy Meadows is a 67 y.o. female with medical history significant for hepatic steatosis and diet-controlled diabetes mellitus, now presenting to the emergency department for evaluation of abdominal discomfort with nausea, vomiting, and bloody diarrhea.  Patient's son is at the bedside and assists with the history.  She had reportedly been visiting [place] for a few months, returning earlier this month, and was in her usual state until 07/25/2017 when she developed generalized abdominal discomfort with nausea, several episodes of nonbloody vomiting, and many episodes of bloody diarrhea.  No recent antibiotic use.  She reports chills but has not checked her temperature.  No one at home is sick.  ED Course: Upon arrival to the ED, patient is found to be afebrile, saturating well on room air, with blood pressure of 80/60.  Chemistry panel is notable for mild metabolic acidosis and CBC features a leukocytosis to 15,900 and hemoglobin of 15.6.  Patient was given 2 L normal saline and 1 g of IV Rocephin in the ED.  Blood pressure remains in the 80s systolic.  Fluid resuscitation will be continued and the patient will be admitted to the stepdown unit for ongoing evaluation and management of acute infectious bloody diarrhea.  Review of Systems:  All other systems reviewed and apart from HPI, are negative.  History reviewed. No pertinent past medical history.  History reviewed. No pertinent surgical history.   has no tobacco, alcohol, and drug history on file.  No Known Allergies  History reviewed. No pertinent family history.   Prior to Admission medications   Not on File    Physical Exam: Vitals:   07/26/17 0047 07/26/17 0054 07/26/17 0057 07/26/17 0113  BP:    (!) 88/67  Pulse: 79 78 79 75  Resp: (!) 29 (!) 23 (!) 25 (!) 23    Temp:      TempSrc:      SpO2: 94% 97% 93% 94%      Constitutional: NAD, calm, appears uncomfortable Eyes: PERTLA, lids and conjunctivae normal ENMT: Mucous membranes are moist. Posterior pharynx clear of any exudate or lesions.   Neck: normal, supple, no masses, no thyromegaly Respiratory: clear to auscultation bilaterally, no wheezing, no crackles. Normal respiratory effort.    Cardiovascular: S1 & S2 heard, regular rate and rhythm. No extremity edema. No significant JVD. Abdomen: No distension, soft, generalized tenderness without rebound pain or guarding. Bowel sounds active.  Musculoskeletal: no clubbing / cyanosis. No joint deformity upper and lower extremities.   Skin: no significant rashes, lesions, ulcers. Warm, dry, well-perfused. Neurologic: CN 2-12 grossly intact. Sensation intact. Strength 5/5 in all 4 limbs.  Psychiatric: Lethargic, easily roused. Calm and cooperative.     Labs on Admission: I have personally reviewed following labs and imaging studies  CBC: Recent Labs  Lab 07/25/17 1804  WBC 15.9*  HGB 15.6*  HCT 50.0*  MCV 85.6  PLT 314   Basic Metabolic Panel: Recent Labs  Lab 07/25/17 1804  NA 139  K 4.2  CL 104  CO2 19*  GLUCOSE 169*  BUN 19  CREATININE 0.99  CALCIUM 9.4   GFR: CrCl cannot be calculated (Unknown ideal weight.). Liver Function Tests: Recent Labs  Lab 07/25/17 1804  AST 27  ALT 24  ALKPHOS 76  BILITOT 0.7  PROT 7.6  ALBUMIN 4.0  Recent Labs  Lab 07/25/17 1804  LIPASE 30   No results for input(s): AMMONIA in the last 168 hours. Coagulation Profile: No results for input(s): INR, PROTIME in the last 168 hours. Cardiac Enzymes: No results for input(s): CKTOTAL, CKMB, CKMBINDEX, TROPONINI in the last 168 hours. BNP (last 3 results) No results for input(s): PROBNP in the last 8760 hours. HbA1C: No results for input(s): HGBA1C in the last 72 hours. CBG: No results for input(s): GLUCAP in the last 168  hours. Lipid Profile: No results for input(s): CHOL, HDL, LDLCALC, TRIG, CHOLHDL, LDLDIRECT in the last 72 hours. Thyroid Function Tests: No results for input(s): TSH, T4TOTAL, FREET4, T3FREE, THYROIDAB in the last 72 hours. Anemia Panel: No results for input(s): VITAMINB12, FOLATE, FERRITIN, TIBC, IRON, RETICCTPCT in the last 72 hours. Urine analysis:    Component Value Date/Time   COLORURINE AMBER (A) 07/25/2017 1746   APPEARANCEUR CLOUDY (A) 07/25/2017 1746   LABSPEC 1.021 07/25/2017 1746   PHURINE 5.0 07/25/2017 1746   GLUCOSEU NEGATIVE 07/25/2017 1746   HGBUR SMALL (A) 07/25/2017 1746   BILIRUBINUR SMALL (A) 07/25/2017 1746   KETONESUR NEGATIVE 07/25/2017 1746   PROTEINUR 30 (A) 07/25/2017 1746   NITRITE NEGATIVE 07/25/2017 1746   LEUKOCYTESUR MODERATE (A) 07/25/2017 1746   Sepsis Labs: (procalcitonin:4,lacticidven:4) )No results found for this or any previous visit (from the past 240 hour(s)).   Radiological Exams on Admission: Ct Abdomen Pelvis W Contrast  Result Date: 07/26/2017 CLINICAL DATA:  Acute generalized abdominal pain. EXAM: CT ABDOMEN AND PELVIS WITH CONTRAST TECHNIQUE: Multidetector CT imaging of the abdomen and pelvis was performed using the standard protocol following bolus administration of intravenous contrast. CONTRAST:  OMNIPAQUE IOHEXOL 300 MG/ML  SOLN COMPARISON:  CT 05/22/2016 FINDINGS: Lower chest: Scattered lung base atelectasis. Hepatobiliary: Mild motion artifact limitations. No focal hepatic lesion. Gallbladder physiologically distended, no calcified stone. No biliary dilatation. Pancreas: No ductal dilatation or inflammation. Spleen: Normal in size without focal abnormality. Small splenule medially. Adrenals/Urinary Tract: Normal adrenal glands. No hydronephrosis or perinephric edema. Homogeneous renal enhancement with symmetric excretion on delayed phase imaging. Urinary bladder is partially distended, equivocal wall thickening felt to  be secondary to degree of distension. Stomach/Bowel: Stomach is nondistended. Small duodenum diverticulum without inflammation. Pelvic small bowel loops are fluid-filled but normal in caliber. Minimal mesenteric edema of the lower mesentery. Fluid within the colon without definite colonic wall thickening. Equivocal colonic wall hyperemia. No appendicitis. Vascular/Lymphatic: Aortic atherosclerosis and tortuosity. No aneurysm. Dilatation of the left ovarian vein measuring 10 mm, mild prominence of right ovarian veins. No adenopathy. Reproductive: Mildly prominent uterine vascularity. No adnexal mass. Other: No free air, free fluid, or intra-abdominal fluid collection. Musculoskeletal: There are no acute or suspicious osseous abnormalities. IMPRESSION: 1. Fluid-filled distal small bowel and colon with mild mucosal hyperemia and mesenteric edema, suggesting enteric colitis/colitis, may be infectious or inflammatory. 2. Prominent ovarian veins and uterine vascularity, can be seen with pelvic congestion syndrome. 3.  Aortic Atherosclerosis (ICD10-I70.0). Electronically Signed   By: Rubye Oaks M.D.   On: 07/26/2017 00:12    EKG: Not performed.   Assessment/Plan   1. Acute infectious diarrhea  - Presents with 1 day of abdominal pain with nausea, vomiting, and bloody diarrhea; diarrhea is the main complaint  - She returned from a visit to [place] earlier this month  - CT findings suggest colitis, no abscess or perforation  - Continue fluid-resuscitation, check stool studies, maintain enteric precautions, start azithromycin, monitor electrolytes    2.  Hypotension  - SBP low 80's in ED  - Likely secondary to hypovolemia in setting of vomiting and diarrhea  - Continue fluid-resuscitation with IVF    3. Diet-controlled DM  - No A1c on file  - Monitor CBG and use low-intensity SSI with Novolog as needed     DVT prophylaxis: SCD's  Code Status: Full  Family Communication: Son updated at  bedside Consults called: None Admission status: Inpatient    Briscoe Deutscher, MD [facility] Pager [phone]  If 7PM-7AM, please contact night-coverage [email or web address] Password [email or web address]  07/26/2017, 1:30 AM

## 2017-07-26 NOTE — ED Notes (Signed)
Full liquid diet dinner tray ordered

## 2017-07-27 DIAGNOSIS — K529 Noninfective gastroenteritis and colitis, unspecified: Secondary | ICD-10-CM

## 2017-07-27 DIAGNOSIS — A09 Infectious gastroenteritis and colitis, unspecified: Principal | ICD-10-CM

## 2017-07-27 LAB — GLUCOSE, CAPILLARY
GLUCOSE-CAPILLARY: 116 mg/dL — AB (ref 65–99)
GLUCOSE-CAPILLARY: 90 mg/dL (ref 65–99)
Glucose-Capillary: 102 mg/dL — ABNORMAL HIGH (ref 65–99)
Glucose-Capillary: 105 mg/dL — ABNORMAL HIGH (ref 65–99)

## 2017-07-27 MED ORDER — SULFAMETHOXAZOLE-TRIMETHOPRIM 800-160 MG PO TABS: 1.0000 | ORAL_TABLET | Freq: Two times a day (BID) | ORAL | 0 refills | Status: AC

## 2017-07-27 NOTE — Discharge Instructions (Signed)
Follow with Primary MD in 7 days   Get CBC, CMP Checked  by Primary MD next visit.    Activity: As tolerated with Full fall precautions use walker/cane & assistance as needed   Disposition Home    Diet: Carb modified diet , with feeding assistance and aspiration precautions.  For Heart failure patients - Check your Weight same time everyday, if you gain over 2 pounds, or you develop in leg swelling, experience more shortness of breath or chest pain, call your Primary MD immediately. Follow Cardiac Low Salt Diet and 1.5 lit/day fluid restriction.   On your next visit with your primary care physician please Get Medicines reviewed and adjusted.   Please request your Prim.MD to go over all Hospital Tests and Procedure/Radiological results at the follow up, please get all Hospital records sent to your Prim MD by signing hospital release before you go home.   If you experience worsening of your admission symptoms, develop shortness of breath, life threatening emergency, suicidal or homicidal thoughts you must seek medical attention immediately by calling 911 or calling your MD immediately  if symptoms less severe.  You Must read complete instructions/literature along with all the possible adverse reactions/side effects for all the Medicines you take and that have been prescribed to you. Take any new Medicines after you have completely understood and accpet all the possible adverse reactions/side effects.   Do not drive, operating heavy machinery, perform activities at heights, swimming or participation in water activities or provide baby sitting services if your were admitted for syncope or siezures until you have seen by Primary MD or a Neurologist and advised to do so again.  Do not drive when taking Pain medications.    Do not take more than prescribed Pain, Sleep and Anxiety Medications  Special Instructions: If you have smoked or chewed Tobacco  in the last 2 yrs please stop smoking,  stop any regular Alcohol  and or any Recreational drug use.  Wear Seat belts while driving.   Please note  You were cared for by a hospitalist during your hospital stay. If you have any questions about your discharge medications or the care you received while you were in the hospital after you are discharged, you can call the unit and asked to speak with the hospitalist on call if the hospitalist that took care of you is not available. Once you are discharged, your primary care physician will handle any further medical issues. Please note that NO REFILLS for any discharge medications will be authorized once you are discharged, as it is imperative that you return to your primary care physician (or establish a relationship with a primary care physician if you do not have one) for your aftercare needs so that they can reassess your need for medications and monitor your lab values.

## 2017-07-27 NOTE — Discharge Summary (Signed)
Amy Meadows, is a 67 y.o. female  DOB [birth year]  MRN [id number].  Admission date:  07/25/2017  Admitting Physician  Briscoe Deutscher, MD  Discharge Date:  07/27/2017   Primary MD  Patient, No Pcp Per  Recommendations for primary care physician for things to follow:  -Check CBC, BMP during next visit   Admission Diagnosis  Dehydration [E86.0] Colitis [K52.9] Pyelonephritis [N12] Hypotension, unspecified hypotension type [I95.9] Diarrhea, unspecified type [R19.7]   Discharge Diagnosis  Dehydration [E86.0] Colitis [K52.9] Pyelonephritis [N12] Hypotension, unspecified hypotension type [I95.9] Diarrhea, unspecified type [R19.7]    Principal Problem:   Acute infectious diarrhea Active Problems:   Hypotension (arterial)   Diet-controlled diabetes mellitus (HCC)      Past Medical History:  Diagnosis Date  . Diet-controlled diabetes mellitus (HCC) 07/26/2017    History reviewed. No pertinent surgical history.     History of present illness and  Hospital Course:     Kindly see H&P for history of present illness and admission details, please review complete Labs, Consult reports and Test reports for all details in brief  HPI  from the history and physical done on the day of admission HPI: Amy Meadows is a 67 y.o. female with medical history significant for hepatic steatosis and diet-controlled diabetes mellitus, now presenting to the emergency department for evaluation of abdominal discomfort with nausea, vomiting, and bloody diarrhea.  Patient's son is at the bedside and assists with the history.  She had reportedly been visiting [place] for a few months, returning earlier this month, and was in her usual state until 07/25/2017 when she developed generalized abdominal discomfort with nausea, several episodes of nonbloody vomiting, and many episodes of bloody diarrhea.  No recent antibiotic use.  She reports  chills but has not checked her temperature.  No one at home is sick.  ED Course: Upon arrival to the ED, patient is found to be afebrile, saturating well on room air, with blood pressure of 80/60.  Chemistry panel is notable for mild metabolic acidosis and CBC features a leukocytosis to 15,900 and hemoglobin of 15.6.  Patient was given 2 L normal saline and 1 g of IV Rocephin in the ED.  Blood pressure remains in the 80s systolic.  Fluid resuscitation will be continued and the patient will be admitted to the stepdown unit for ongoing evaluation and management of acute infectious bloody diarrhea.  Hospital course  1.Acute infectious diarrhea /E. coli colitis - Presents with 1 day of abdominal pain with nausea, vomiting, and bloody diarrhea; diarrhea is the main complaint , CT findings suggest colitis, but no abscess or perforation, she was started on azithromycin, and her diet was advanced gradually, no nausea, no vomiting, her diarrhea has significantly subsided, she had only 2 small bowel movements overnight, nonbloody anymore, stool panel came back significant for neurovirus, and E. coli, she will be discharged on total 1 week of Bactrim.  2. Hypotension  - SBP low 80's in ED  - Likely secondary to hypovolemia in setting of vomiting  and diarrhea  - Improved with IV fluids, she denies any further nausea or vomiting, she reports good appetite with good oral and fluid intake  3. Diet-controlled DM  - No A1c on file , was kept on insulin sliding scale during hospital stay, with good control of CBGs and almost no insulin requirement       Discharge Condition:  Stable   Follow UP      Discharge Instructions  and  Discharge Medications     Discharge Instructions    Discharge instructions   Complete by:  As directed    Follow with Primary MD in 7 days   Get CBC, CMP Checked  by Primary MD next visit.    Activity: As tolerated with Full fall precautions use walker/cane &  assistance as needed   Disposition Home    Diet: Carb modified diet , with feeding assistance and aspiration precautions.  For Heart failure patients - Check your Weight same time everyday, if you gain over 2 pounds, or you develop in leg swelling, experience more shortness of breath or chest pain, call your Primary MD immediately. Follow Cardiac Low Salt Diet and 1.5 lit/day fluid restriction.   On your next visit with your primary care physician please Get Medicines reviewed and adjusted.   Please request your Prim.MD to go over all Hospital Tests and Procedure/Radiological results at the follow up, please get all Hospital records sent to your Prim MD by signing hospital release before you go home.   If you experience worsening of your admission symptoms, develop shortness of breath, life threatening emergency, suicidal or homicidal thoughts you must seek medical attention immediately by calling 911 or calling your MD immediately  if symptoms less severe.  You Must read complete instructions/literature along with all the possible adverse reactions/side effects for all the Medicines you take and that have been prescribed to you. Take any new Medicines after you have completely understood and accpet all the possible adverse reactions/side effects.   Do not drive, operating heavy machinery, perform activities at heights, swimming or participation in water activities or provide baby sitting services if your were admitted for syncope or siezures until you have seen by Primary MD or a Neurologist and advised to do so again.  Do not drive when taking Pain medications.    Do not take more than prescribed Pain, Sleep and Anxiety Medications  Special Instructions: If you have smoked or chewed Tobacco  in the last 2 yrs please stop smoking, stop any regular Alcohol  and or any Recreational drug use.  Wear Seat belts while driving.   Please note  You were cared for by a hospitalist during  your hospital stay. If you have any questions about your discharge medications or the care you received while you were in the hospital after you are discharged, you can call the unit and asked to speak with the hospitalist on call if the hospitalist that took care of you is not available. Once you are discharged, your primary care physician will handle any further medical issues. Please note that NO REFILLS for any discharge medications will be authorized once you are discharged, as it is imperative that you return to your primary care physician (or establish a relationship with a primary care physician if you do not have one) for your aftercare needs so that they can reassess your need for medications and monitor your lab values.   Increase activity slowly   Complete by:  As directed  Allergies as of 07/27/2017   No Known Allergies     Medication List    TAKE these medications   sulfamethoxazole-trimethoprim 800-160 MG tablet Commonly known as:  BACTRIM DS,SEPTRA DS Take 1 tablet by mouth 2 (two) times daily.         Diet and Activity recommendation: See Discharge Instructions above   Consults obtained - none   Major procedures and Radiology Reports - PLEASE review detailed and final reports for all details, in brief -     Ct Abdomen Pelvis W Contrast  Result Date: 07/26/2017 CLINICAL DATA:  Acute generalized abdominal pain. EXAM: CT ABDOMEN AND PELVIS WITH CONTRAST TECHNIQUE: Multidetector CT imaging of the abdomen and pelvis was performed using the standard protocol following bolus administration of intravenous contrast. CONTRAST:  OMNIPAQUE IOHEXOL 300 MG/ML  SOLN COMPARISON:  CT 05/22/2016 FINDINGS: Lower chest: Scattered lung base atelectasis. Hepatobiliary: Mild motion artifact limitations. No focal hepatic lesion. Gallbladder physiologically distended, no calcified stone. No biliary dilatation. Pancreas: No ductal dilatation or inflammation. Spleen: Normal in size  without focal abnormality. Small splenule medially. Adrenals/Urinary Tract: Normal adrenal glands. No hydronephrosis or perinephric edema. Homogeneous renal enhancement with symmetric excretion on delayed phase imaging. Urinary bladder is partially distended, equivocal wall thickening felt to be secondary to degree of distension. Stomach/Bowel: Stomach is nondistended. Small duodenum diverticulum without inflammation. Pelvic small bowel loops are fluid-filled but normal in caliber. Minimal mesenteric edema of the lower mesentery. Fluid within the colon without definite colonic wall thickening. Equivocal colonic wall hyperemia. No appendicitis. Vascular/Lymphatic: Aortic atherosclerosis and tortuosity. No aneurysm. Dilatation of the left ovarian vein measuring 10 mm, mild prominence of right ovarian veins. No adenopathy. Reproductive: Mildly prominent uterine vascularity. No adnexal mass. Other: No free air, free fluid, or intra-abdominal fluid collection. Musculoskeletal: There are no acute or suspicious osseous abnormalities. IMPRESSION: 1. Fluid-filled distal small bowel and colon with mild mucosal hyperemia and mesenteric edema, suggesting enteric colitis/colitis, may be infectious or inflammatory. 2. Prominent ovarian veins and uterine vascularity, can be seen with pelvic congestion syndrome. 3.  Aortic Atherosclerosis (ICD10-I70.0). Electronically Signed   By: Rubye Oaks M.D.   On: 07/26/2017 00:12    Micro Results    Recent Results (from the past 240 hour(s))  C difficile quick scan w PCR reflex     Status: None   Collection Time: 07/26/17  4:38 AM  Result Value Ref Range Status   C Diff antigen NEGATIVE NEGATIVE Final   C Diff toxin NEGATIVE NEGATIVE Final   C Diff interpretation No C. difficile detected.  Final    Comment: Performed at [facility], [address] [place], [place] [address]  Gastrointestinal Panel by PCR , Stool     Status: Abnormal   Collection Time: 07/26/17   4:38 AM  Result Value Ref Range Status   Campylobacter species NOT DETECTED NOT DETECTED Final   Plesimonas shigelloides NOT DETECTED NOT DETECTED Final   Salmonella species NOT DETECTED NOT DETECTED Final   Yersinia enterocolitica NOT DETECTED NOT DETECTED Final   Vibrio species NOT DETECTED NOT DETECTED Final   Vibrio cholerae NOT DETECTED NOT DETECTED Final   Enteroaggregative E coli (EAEC) DETECTED (A) NOT DETECTED Final    Comment: RESULT CALLED TO, READ BACK BY AND VERIFIED WITH: [place] GROSE  07/26/17 AKT    Enteropathogenic E coli (EPEC) DETECTED (A) NOT DETECTED Final    Comment: RESULT CALLED TO, READ BACK BY AND VERIFIED WITH: [place] GROSE  07/26/17 AKT  Enterotoxigenic E coli (ETEC) NOT DETECTED NOT DETECTED Final   Shiga like toxin producing E coli (STEC) NOT DETECTED NOT DETECTED Final   Shigella/Enteroinvasive E coli (EIEC) NOT DETECTED NOT DETECTED Final   Cryptosporidium NOT DETECTED NOT DETECTED Final   Cyclospora cayetanensis NOT DETECTED NOT DETECTED Final   Entamoeba histolytica NOT DETECTED NOT DETECTED Final   Giardia lamblia NOT DETECTED NOT DETECTED Final   Adenovirus F40/41 NOT DETECTED NOT DETECTED Final   Astrovirus NOT DETECTED NOT DETECTED Final   Norovirus GI/GII DETECTED (A) NOT DETECTED Final    Comment: RESULT CALLED TO, READ BACK BY AND VERIFIED WITH: [place] GROSE  07/26/17 AKT    Rotavirus A NOT DETECTED NOT DETECTED Final   Sapovirus (I, II, IV, and V) NOT DETECTED NOT DETECTED Final    Comment: Performed at [facility], [address], [place], [place] [address]       Today   Subjective:   Amy Meadows today has no headache,no chest or abdominal pain, is tolerating oral intake, no nausea, no vomiting, only 2 small loose bowel movement overnight Objective:   Blood pressure 102/87, pulse 60, temperature 98.8 F (37.1 C), temperature source Oral, resp. rate (!) 24, weight 66 kg (145 lb 8.1 oz), SpO2 97 %.  No intake or  output data in the 24 hours ending 07/27/17 1429  Exam Awake Alert, Oriented x 3, No new F.N deficits, Normal affect Symmetrical Chest wall movement, Good air movement bilaterally, CTAB RRR,No Gallops,Rubs or new Murmurs, No Parasternal Heave +ve B.Sounds, Abd Soft, Non tender,  No rebound -guarding or rigidity. No Cyanosis, Clubbing or edema, No new Rash or bruise   Online interpreter has been used during physical exam evaluation  Data Review   CBC w Diff:  Lab Results  Component Value Date   WBC 12.0 (H) 07/26/2017   HGB 12.2 07/26/2017   HCT 37.9 07/26/2017   PLT 256 07/26/2017   LYMPHOPCT 16 07/26/2017   MONOPCT 5 07/26/2017   EOSPCT 1 07/26/2017   BASOPCT 0 07/26/2017    CMP:  Lab Results  Component Value Date   NA 140 07/26/2017   K 3.8 07/26/2017   CL 113 (H) 07/26/2017   CO2 18 (L) 07/26/2017   BUN 13 07/26/2017   CREATININE 0.80 07/26/2017   PROT 7.6 07/25/2017   ALBUMIN 4.0 07/25/2017   BILITOT 0.7 07/25/2017   ALKPHOS 76 07/25/2017   AST 27 07/25/2017   ALT 24 07/25/2017  .   Total Time in preparing paper work, data evaluation and todays exam - 28 minutes  Huey Bienenstock M.D on 07/27/2017 at 2:29 PM  [facility]   Office  [phone]

## 2017-09-04 DIAGNOSIS — R82998 Other abnormal findings in urine: Secondary | ICD-10-CM | POA: Diagnosis not present

## 2017-09-04 DIAGNOSIS — R300 Dysuria: Secondary | ICD-10-CM | POA: Diagnosis not present

## 2017-10-29 DIAGNOSIS — E119 Type 2 diabetes mellitus without complications: Secondary | ICD-10-CM | POA: Diagnosis not present

## 2017-10-29 DIAGNOSIS — N390 Urinary tract infection, site not specified: Secondary | ICD-10-CM | POA: Diagnosis not present

## 2017-10-29 DIAGNOSIS — R309 Painful micturition, unspecified: Secondary | ICD-10-CM | POA: Diagnosis not present

## 2017-11-01 ENCOUNTER — Other Ambulatory Visit: Payer: Self-pay

## 2017-11-01 ENCOUNTER — Emergency Department (HOSPITAL_COMMUNITY)
Admission: EM | Admit: 2017-11-01 | Discharge: 2017-11-02 | Disposition: A | Discharge status: Home / Self Care | Payer: BLUE CROSS/BLUE SHIELD | Attending: Emergency Medicine | Admitting: Emergency Medicine

## 2017-11-01 ENCOUNTER — Encounter (HOSPITAL_COMMUNITY): Payer: Self-pay | Admitting: Emergency Medicine

## 2017-11-01 DIAGNOSIS — R509 Fever, unspecified: Secondary | ICD-10-CM | POA: Diagnosis not present

## 2017-11-01 DIAGNOSIS — E119 Type 2 diabetes mellitus without complications: Secondary | ICD-10-CM | POA: Insufficient documentation

## 2017-11-01 DIAGNOSIS — N390 Urinary tract infection, site not specified: Secondary | ICD-10-CM | POA: Diagnosis not present

## 2017-11-01 DIAGNOSIS — R110 Nausea: Secondary | ICD-10-CM | POA: Diagnosis not present

## 2017-11-01 DIAGNOSIS — R1030 Lower abdominal pain, unspecified: Secondary | ICD-10-CM | POA: Insufficient documentation

## 2017-11-01 DIAGNOSIS — R300 Dysuria: Secondary | ICD-10-CM | POA: Diagnosis not present

## 2017-11-01 LAB — URINALYSIS, ROUTINE W REFLEX MICROSCOPIC
BILIRUBIN URINE: NEGATIVE
GLUCOSE, UA: NEGATIVE mg/dL
HGB URINE DIPSTICK: NEGATIVE
Ketones, ur: NEGATIVE mg/dL
Leukocytes, UA: NEGATIVE
Nitrite: NEGATIVE
Protein, ur: NEGATIVE mg/dL
Specific Gravity, Urine: 1.004 — ABNORMAL LOW (ref 1.005–1.030)
pH: 6 (ref 5.0–8.0)

## 2017-11-01 NOTE — ED Triage Notes (Signed)
[place] interpreter used for triage. Pt reports decrease urinary frequency, burning with urination, and feels like she isn't urinating enough. States she has episodes of chills at home, afebrile in triage. Pt saw her PCP 3 days ago for UTI and is currently taking bactrim. But reports its not getting better.

## 2017-11-01 NOTE — ED Notes (Signed)
Need translator. Translator in room. Language [place].

## 2017-11-02 LAB — CBC WITH DIFFERENTIAL/PLATELET
Abs Immature Granulocytes: 0 10*3/uL (ref 0.0–0.1)
BASOS ABS: 0.1 10*3/uL (ref 0.0–0.1)
Basophils Relative: 1 %
Eosinophils Absolute: 0.1 10*3/uL (ref 0.0–0.7)
Eosinophils Relative: 2 %
HCT: 42.2 % (ref 36.0–46.0)
Hemoglobin: 13.4 g/dL (ref 12.0–15.0)
Immature Granulocytes: 0 %
Lymphocytes Relative: 49 %
Lymphs Abs: 3 10*3/uL (ref 0.7–4.0)
MCH: 27.5 pg (ref 26.0–34.0)
MCHC: 31.8 g/dL (ref 30.0–36.0)
MCV: 86.5 fL (ref 78.0–100.0)
MONO ABS: 0.5 10*3/uL (ref 0.1–1.0)
MONOS PCT: 7 %
Neutro Abs: 2.5 10*3/uL (ref 1.7–7.7)
Neutrophils Relative %: 41 %
Platelets: 243 10*3/uL (ref 150–400)
RBC: 4.88 MIL/uL (ref 3.87–5.11)
RDW: 11.9 % (ref 11.5–15.5)
WBC: 6.2 10*3/uL (ref 4.0–10.5)

## 2017-11-02 LAB — LACTIC ACID, PLASMA: Lactic Acid, Venous: 1.4 mmol/L (ref 0.5–1.9)

## 2017-11-02 LAB — COMPREHENSIVE METABOLIC PANEL
ALBUMIN: 3.7 g/dL (ref 3.5–5.0)
ALT: 20 U/L (ref 0–44)
AST: 22 U/L (ref 15–41)
Alkaline Phosphatase: 69 U/L (ref 38–126)
Anion gap: 9 (ref 5–15)
BUN: 9 mg/dL (ref 8–23)
CALCIUM: 9.3 mg/dL (ref 8.9–10.3)
CO2: 22 mmol/L (ref 22–32)
Chloride: 107 mmol/L (ref 98–111)
Creatinine, Ser: 0.92 mg/dL (ref 0.44–1.00)
GFR calc non Af Amer: >60 mL/min (ref >60)
GLUCOSE: 98 mg/dL (ref 70–99)
Potassium: 4.1 mmol/L (ref 3.5–5.1)
SODIUM: 138 mmol/L (ref 135–145)
Total Bilirubin: 0.8 mg/dL (ref 0.3–1.2)
Total Protein: 7 g/dL (ref 6.5–8.1)

## 2017-11-02 MED ORDER — PHENAZOPYRIDINE HCL 100 MG PO TABS
200.0000 mg | ORAL_TABLET | Freq: Once | ORAL | Status: AC
  Administered 2017-11-02: 200 mg via ORAL
  Filled 2017-11-02: qty 2

## 2017-11-02 MED ORDER — ACETAMINOPHEN 325 MG PO TABS
650.0000 mg | ORAL_TABLET | Freq: Once | ORAL | Status: AC
  Administered 2017-11-02: 650 mg via ORAL
  Filled 2017-11-02: qty 2

## 2017-11-02 MED ORDER — PHENAZOPYRIDINE HCL 200 MG PO TABS: 200.0000 mg | ORAL_TABLET | Freq: Three times a day (TID) | ORAL | 0 refills | Status: AC

## 2017-11-02 NOTE — ED Provider Notes (Signed)
[facility] Provider Note   CSN: [id number] Arrival date & time: 11/01/17  2206     History   Chief Complaint Chief Complaint  Patient presents with  . Urinary Tract Infection    HPI Amy Dedra SkeensKa is a 67 y.o. female.  Patient presents with family for evaluation of persistent symptoms of suprapubic and vaginal pain associated with urination. She was seen [facility] at [facility] for symptoms of diarrhea, suprapubic pain, dysuria and discharged home on Septra DS for UTI. Per son, she felt better on day 1 but symptoms returned except diarrhea and have been persistent. She reports nausea without vomiting. Per son, she is not eating or drinking per her usual. No flank pain or back pain. No chest pain, SOB, cough.   The history is provided by a relative. A language interpreter was used Garment/textile technologist(Interpreter is son at bedside).  Urinary Tract Infection   Associated symptoms include chills and nausea. Pertinent negatives include no vomiting and no flank pain.    Past Medical History:  Diagnosis Date  . Diet-controlled diabetes mellitus (HCC) 07/26/2017    Patient Active Problem List   Diagnosis Date Noted  . Acute infectious diarrhea 07/26/2017  . Hypotension (arterial) 07/26/2017  . Diet-controlled diabetes mellitus (HCC) 07/26/2017    History reviewed. No pertinent surgical history.   OB History   None      Home Medications    Prior to Admission medications   Medication Sig Start Date End Date Taking? Authorizing Provider  sulfamethoxazole-trimethoprim (BACTRIM DS,SEPTRA DS) 800-160 MG tablet Take 1 tablet by mouth 2 (two) times daily. 07/27/17   Elgergawy, Leana Roeawood S, MD    Family History No family history on file.  Social History Social History   Tobacco Use  . Smoking status: Never Smoker  . Smokeless tobacco: Never Used  Substance Use Topics  . Alcohol use: Not Currently  . Drug use: Not Currently     Allergies   Patient has no known  allergies.   Review of Systems Review of Systems  Constitutional: Positive for appetite change, chills and fever.  HENT: Negative.   Respiratory: Negative.   Cardiovascular: Negative.   Gastrointestinal: Positive for abdominal pain and nausea. Negative for diarrhea and vomiting.  Genitourinary: Positive for dysuria. Negative for flank pain.  Musculoskeletal: Negative for back pain.  Skin: Negative for rash.  Neurological: Negative for weakness and headaches.     Physical Exam Updated Vital Signs BP 117/77 (BP Location: Right Arm)   Pulse 72   Temp 98.5 F (36.9 C) (Oral)   Resp 20   Ht 5\' 1"  (1.549 m)   Wt 59 kg   SpO2 98%   BMI 24.56 kg/m   Physical Exam  Constitutional: She is oriented to person, place, and time. She appears well-developed and well-nourished. No distress.  HENT:  Head: Normocephalic.  Neck: Normal range of motion. Neck supple.  Cardiovascular: Normal rate.  Pulmonary/Chest: Effort normal.  Abdominal: Soft. Bowel sounds are normal. There is tenderness (Suprapubic abdominal tenderness.). There is no rebound and no guarding.  Musculoskeletal: Normal range of motion.  Neurological: She is alert and oriented to person, place, and time.  Skin: Skin is warm and dry. No rash noted. She is not diaphoretic.  Psychiatric: She has a normal mood and affect.     ED Treatments / Results  Labs (all labs ordered are listed, but only abnormal results are displayed) Labs Reviewed  URINALYSIS, ROUTINE W REFLEX MICROSCOPIC - Abnormal; Notable  for the following components:      Result Value   Color, Urine STRAW (*)    Specific Gravity, Urine 1.004 (*)    All other components within normal limits  CBC WITH DIFFERENTIAL/PLATELET  COMPREHENSIVE METABOLIC PANEL  LACTIC ACID, PLASMA  LACTIC ACID, PLASMA   Results for orders placed or performed during the hospital encounter of 11/01/17  Urinalysis, Routine w reflex microscopic- may I&O cath if menses  Result Value  Ref Range   Color, Urine STRAW (A) YELLOW   APPearance CLEAR CLEAR   Specific Gravity, Urine 1.004 (L) 1.005 - 1.030   pH 6.0 5.0 - 8.0   Glucose, UA NEGATIVE NEGATIVE mg/dL   Hgb urine dipstick NEGATIVE NEGATIVE   Bilirubin Urine NEGATIVE NEGATIVE   Ketones, ur NEGATIVE NEGATIVE mg/dL   Protein, ur NEGATIVE NEGATIVE mg/dL   Nitrite NEGATIVE NEGATIVE   Leukocytes, UA NEGATIVE NEGATIVE  CBC with Differential  Result Value Ref Range   WBC 6.2 4.0 - 10.5 K/uL   RBC 4.88 3.87 - 5.11 MIL/uL   Hemoglobin 13.4 12.0 - 15.0 g/dL   HCT 04.5 40.9 - 81.1 %   MCV 86.5 78.0 - 100.0 fL   MCH 27.5 26.0 - 34.0 pg   MCHC 31.8 30.0 - 36.0 g/dL   RDW 91.4 78.2 - 95.6 %   Platelets 243 150 - 400 K/uL   Neutrophils Relative % 41 %   Neutro Abs 2.5 1.7 - 7.7 K/uL   Lymphocytes Relative 49 %   Lymphs Abs 3.0 0.7 - 4.0 K/uL   Monocytes Relative 7 %   Monocytes Absolute 0.5 0.1 - 1.0 K/uL   Eosinophils Relative 2 %   Eosinophils Absolute 0.1 0.0 - 0.7 K/uL   Basophils Relative 1 %   Basophils Absolute 0.1 0.0 - 0.1 K/uL   Immature Granulocytes 0 %   Abs Immature Granulocytes 0.0 0.0 - 0.1 K/uL    EKG None  Radiology No results found.  Procedures Procedures (including critical care time)  Medications Ordered in ED Medications  acetaminophen (TYLENOL) tablet 650 mg (has no administration in time range)     Initial Impression / Assessment and Plan / ED Course  I have reviewed the triage vital signs and the nursing notes.  Pertinent labs & imaging results that were available during my care of the patient were reviewed by me and considered in my medical decision making (see chart for details).     Patient here with family who are concerned for ongoing symptoms of suprapubic abdominal pain and dysuria. Dx with UTI on 10/29/17, on abx, prompted to come in tonight because of persistent symptoms. Chills, decreased appetite.   She is nontoxic in appearance. Mild abdominal tenderness limited  to suprapubic area. VSS, no fever. Labs are normal including CBC, CMET, Lactate. UA shows improved results when compared to 10/29/17.   Discussed with Dr. Preston Fleeting. Will start on pyridium for symptoms of discomfort. Encourage follow up with [facility] for review of culture results to confirm appropriate antibiotic choice. She is felt stable for discharge home.   Final Clinical Impressions(s) / ED Diagnoses   Final diagnoses:  None   1. Suprapubic abdominal pain 2. Recent history of UTI.  ED Discharge Orders    None       Danne Harbor 11/02/17 0138    Dione Booze, MD 11/02/17 0900

## 2017-11-02 NOTE — ED Notes (Signed)
Patient verbalizes understanding of medications and discharge instructions. No further questions at this time. VSS and patient ambulatory at discharge.   

## 2017-11-02 NOTE — Discharge Instructions (Addendum)
You can be discharged home tonight and have been given a prescription for medication to help with lower abdominal pain. You can take Tylenol in addition to this medication for more relief. [facility] where you [facility] treated for urinary tract infection to review the culture results and recheck your symptoms to make sure you are getting better.

## 2018-12-12 DIAGNOSIS — Z6826 Body mass index (BMI) 26.0-26.9, adult: Secondary | ICD-10-CM | POA: Diagnosis not present

## 2018-12-12 DIAGNOSIS — G609 Hereditary and idiopathic neuropathy, unspecified: Secondary | ICD-10-CM | POA: Diagnosis not present

## 2018-12-12 DIAGNOSIS — M159 Polyosteoarthritis, unspecified: Secondary | ICD-10-CM | POA: Diagnosis not present

## 2018-12-12 DIAGNOSIS — R5382 Chronic fatigue, unspecified: Secondary | ICD-10-CM | POA: Diagnosis not present

## 2018-12-19 DIAGNOSIS — Z23 Encounter for immunization: Secondary | ICD-10-CM | POA: Diagnosis not present

## 2018-12-19 DIAGNOSIS — Z9181 History of falling: Secondary | ICD-10-CM | POA: Diagnosis not present

## 2018-12-19 DIAGNOSIS — M159 Polyosteoarthritis, unspecified: Secondary | ICD-10-CM | POA: Diagnosis not present

## 2018-12-19 DIAGNOSIS — Z1331 Encounter for screening for depression: Secondary | ICD-10-CM | POA: Diagnosis not present

## 2018-12-19 DIAGNOSIS — G5603 Carpal tunnel syndrome, bilateral upper limbs: Secondary | ICD-10-CM | POA: Diagnosis not present

## 2018-12-19 DIAGNOSIS — G609 Hereditary and idiopathic neuropathy, unspecified: Secondary | ICD-10-CM | POA: Diagnosis not present

## 2018-12-19 DIAGNOSIS — M659 Synovitis and tenosynovitis, unspecified: Secondary | ICD-10-CM | POA: Diagnosis not present

## 2018-12-26 DIAGNOSIS — M659 Synovitis and tenosynovitis, unspecified: Secondary | ICD-10-CM | POA: Diagnosis not present

## 2018-12-26 DIAGNOSIS — G609 Hereditary and idiopathic neuropathy, unspecified: Secondary | ICD-10-CM | POA: Diagnosis not present

## 2018-12-26 DIAGNOSIS — M159 Polyosteoarthritis, unspecified: Secondary | ICD-10-CM | POA: Diagnosis not present

## 2018-12-26 DIAGNOSIS — R5382 Chronic fatigue, unspecified: Secondary | ICD-10-CM | POA: Diagnosis not present

## 2018-12-29 ENCOUNTER — Encounter (HOSPITAL_COMMUNITY): Payer: Self-pay | Admitting: Emergency Medicine

## 2018-12-29 ENCOUNTER — Emergency Department (HOSPITAL_COMMUNITY)
Admission: EM | Admit: 2018-12-29 | Discharge: 2018-12-30 | Disposition: A | Discharge status: Home / Self Care | Payer: BC Managed Care – PPO | Attending: Emergency Medicine | Admitting: Emergency Medicine

## 2018-12-29 ENCOUNTER — Other Ambulatory Visit: Payer: Self-pay

## 2018-12-29 DIAGNOSIS — I499 Cardiac arrhythmia, unspecified: Secondary | ICD-10-CM | POA: Diagnosis not present

## 2018-12-29 DIAGNOSIS — R9431 Abnormal electrocardiogram [ECG] [EKG]: Secondary | ICD-10-CM | POA: Diagnosis not present

## 2018-12-29 DIAGNOSIS — R61 Generalized hyperhidrosis: Secondary | ICD-10-CM | POA: Insufficient documentation

## 2018-12-29 DIAGNOSIS — E119 Type 2 diabetes mellitus without complications: Secondary | ICD-10-CM | POA: Diagnosis not present

## 2018-12-29 DIAGNOSIS — R1084 Generalized abdominal pain: Secondary | ICD-10-CM | POA: Diagnosis not present

## 2018-12-29 DIAGNOSIS — R1013 Epigastric pain: Secondary | ICD-10-CM | POA: Diagnosis not present

## 2018-12-29 DIAGNOSIS — I10 Essential (primary) hypertension: Secondary | ICD-10-CM | POA: Diagnosis not present

## 2018-12-29 DIAGNOSIS — Z79899 Other long term (current) drug therapy: Secondary | ICD-10-CM | POA: Diagnosis not present

## 2018-12-29 DIAGNOSIS — R112 Nausea with vomiting, unspecified: Secondary | ICD-10-CM | POA: Diagnosis not present

## 2018-12-29 DIAGNOSIS — R1010 Upper abdominal pain, unspecified: Secondary | ICD-10-CM | POA: Diagnosis not present

## 2018-12-29 DIAGNOSIS — R52 Pain, unspecified: Secondary | ICD-10-CM | POA: Diagnosis not present

## 2018-12-29 LAB — COMPREHENSIVE METABOLIC PANEL
ALT: 26 U/L (ref 0–44)
AST: 43 U/L — ABNORMAL HIGH (ref 15–41)
Albumin: 3.8 g/dL (ref 3.5–5.0)
Alkaline Phosphatase: 72 U/L (ref 38–126)
Anion gap: 11 (ref 5–15)
BUN: 21 mg/dL (ref 8–23)
CO2: 21 mmol/L — ABNORMAL LOW (ref 22–32)
Calcium: 8.9 mg/dL (ref 8.9–10.3)
Chloride: 103 mmol/L (ref 98–111)
Creatinine, Ser: 0.76 mg/dL (ref 0.44–1.00)
GFR calc Af Amer: >60 mL/min (ref >60)
GFR calc non Af Amer: >60 mL/min (ref >60)
Glucose, Bld: 130 mg/dL — ABNORMAL HIGH (ref 70–99)
Potassium: 5.5 mmol/L — ABNORMAL HIGH (ref 3.5–5.1)
Sodium: 135 mmol/L (ref 135–145)
Total Bilirubin: 0.6 mg/dL (ref 0.3–1.2)
Total Protein: 6.8 g/dL (ref 6.5–8.1)

## 2018-12-29 LAB — URINALYSIS, ROUTINE W REFLEX MICROSCOPIC
Bilirubin Urine: NEGATIVE
Glucose, UA: NEGATIVE mg/dL
Hgb urine dipstick: NEGATIVE
Ketones, ur: NEGATIVE mg/dL
Leukocytes,Ua: NEGATIVE
Nitrite: NEGATIVE
Protein, ur: NEGATIVE mg/dL
Specific Gravity, Urine: 1.024 (ref 1.005–1.030)
pH: 5 (ref 5.0–8.0)

## 2018-12-29 LAB — CBC
HCT: 42.9 % (ref 36.0–46.0)
Hemoglobin: 13.9 g/dL (ref 12.0–15.0)
MCH: 27.7 pg (ref 26.0–34.0)
MCHC: 32.4 g/dL (ref 30.0–36.0)
MCV: 85.5 fL (ref 80.0–100.0)
Platelets: 366 10*3/uL (ref 150–400)
RBC: 5.02 MIL/uL (ref 3.87–5.11)
RDW: 13.1 % (ref 11.5–15.5)
WBC: 14.9 10*3/uL — ABNORMAL HIGH (ref 4.0–10.5)
nRBC: 0 % (ref 0.0–0.2)

## 2018-12-29 LAB — LIPASE, BLOOD: Lipase: 35 U/L (ref 11–51)

## 2018-12-29 MED ORDER — SODIUM CHLORIDE 0.9% FLUSH
3.0000 mL | Freq: Once | INTRAVENOUS | Status: AC
  Administered 2018-12-29: 3 mL via INTRAVENOUS

## 2018-12-29 NOTE — ED Triage Notes (Signed)
Pt transported from home by EMS with c/o upper epigastric pain onset 30 min after eating dinner, denies n/v.  Primary language [place].

## 2018-12-29 NOTE — ED Triage Notes (Signed)
Pt from home. Speaks [place]. Pt reports intermittent mid abd pain that started today with diaphoresis. Pt reports pain started after she ate some food. Denies N/V/D. Pt received 324 of asa by ems.

## 2018-12-30 ENCOUNTER — Emergency Department (HOSPITAL_COMMUNITY): Payer: BC Managed Care – PPO

## 2018-12-30 DIAGNOSIS — R112 Nausea with vomiting, unspecified: Secondary | ICD-10-CM | POA: Diagnosis not present

## 2018-12-30 DIAGNOSIS — R1010 Upper abdominal pain, unspecified: Secondary | ICD-10-CM | POA: Diagnosis not present

## 2018-12-30 MED ORDER — PANTOPRAZOLE SODIUM 40 MG PO TBEC: 40.0000 mg | DELAYED_RELEASE_TABLET | Freq: Every day | ORAL | 3 refills | Status: AC

## 2018-12-30 MED ORDER — SODIUM CHLORIDE 0.9 % IV BOLUS
500.0000 mL | Freq: Once | INTRAVENOUS | Status: AC
  Administered 2018-12-30: 500 mL via INTRAVENOUS

## 2018-12-30 MED ORDER — HYDROMORPHONE HCL 1 MG/ML IJ SOLN
0.5000 mg | Freq: Once | INTRAMUSCULAR | Status: AC
  Administered 2018-12-30: 04:00:00 0.5 mg via INTRAVENOUS
  Filled 2018-12-30: qty 1

## 2018-12-30 MED ORDER — PANTOPRAZOLE SODIUM 40 MG PO TBEC: 40.0000 mg | DELAYED_RELEASE_TABLET | Freq: Every day | ORAL | 3 refills | Status: DC

## 2018-12-30 MED ORDER — ONDANSETRON HCL 4 MG/2ML IJ SOLN
4.0000 mg | Freq: Once | INTRAMUSCULAR | Status: AC
  Administered 2018-12-30: 04:00:00 4 mg via INTRAVENOUS
  Filled 2018-12-30: qty 2

## 2018-12-30 MED ORDER — HYOSCYAMINE SULFATE 0.125 MG SL SUBL: 0.1250 mg | SUBLINGUAL_TABLET | SUBLINGUAL | 0 refills | Status: DC | PRN

## 2018-12-30 MED ORDER — HYOSCYAMINE SULFATE 0.125 MG SL SUBL: 0.1250 mg | SUBLINGUAL_TABLET | SUBLINGUAL | 0 refills | Status: AC | PRN

## 2018-12-30 NOTE — ED Notes (Signed)
Pt transported to [place] via stretcher. Transport informed pt doesn't speak english, that translator is needed for scan.

## 2018-12-30 NOTE — ED Notes (Signed)
Pt caregiver TYUEN verbalized understanding of d/c instruction, scripts, follow up care and s/s requiring return to ED. Neither pt or caregiver has any additional questions.

## 2018-12-30 NOTE — ED Provider Notes (Signed)
[facility] EMERGENCY DEPARTMENT Provider Note   CSN: [id number] Arrival date & time: 12/29/18  2037     History   Chief Complaint Chief Complaint  Patient presents with   Abdominal Pain    HPI Amy Meadows is a 68 y.o. female.     Patient presents to the ER for evaluation of abdominal pain.  Patient complaining of persistent mid upper abdominal pain that began earlier today after eating.  No nausea, vomiting or diarrhea.  She has had some diaphoresis secondary to the pain.  No chest pain or shortness of breath.  No previous abdominal surgery.     Past Medical History:  Diagnosis Date   Diet-controlled diabetes mellitus ([place]) 07/26/2017    Patient Active Problem List   Diagnosis Date Noted   Acute infectious diarrhea 07/26/2017   Hypotension (arterial) 07/26/2017   Diet-controlled diabetes mellitus ([place]) 07/26/2017    History reviewed. No pertinent surgical history.   OB History   No obstetric history on file.      Home Medications    Prior to Admission medications   Medication Sig Start Date End Date Taking? Authorizing Provider  hyoscyamine (LEVSIN SL) 0.125 MG SL tablet Place 1 tablet (0.125 mg total) under the tongue every 4 (four) hours as needed for cramping (abdominal pain). 12/30/18   Orpah Greek, MD  pantoprazole (PROTONIX) 40 MG tablet Take 1 tablet (40 mg total) by mouth daily. 12/30/18   Orpah Greek, MD  phenazopyridine (PYRIDIUM) 200 MG tablet Take 1 tablet (200 mg total) by mouth 3 (three) times daily. 11/02/17   Charlann Lange, PA-C  sulfamethoxazole-trimethoprim (BACTRIM DS,SEPTRA DS) 800-160 MG tablet Take 1 tablet by mouth 2 (two) times daily. 07/27/17   Elgergawy, Silver Huguenin, MD    Family History No family history on file.  Social History Social History   Tobacco Use   Smoking status: Never Smoker   Smokeless tobacco: Never Used  Substance Use Topics   Alcohol use: Not Currently   Drug use: Not  Currently     Allergies   Patient has no known allergies.   Review of Systems Review of Systems  Constitutional: Positive for diaphoresis.  Gastrointestinal: Positive for abdominal pain.  All other systems reviewed and are negative.    Physical Exam Updated Vital Signs BP 100/64    Pulse (!) 59    Temp 98.4 F (36.9 C) (Oral)    Resp 18    SpO2 95%   Physical Exam Vitals signs and nursing note reviewed.  Constitutional:      General: She is not in acute distress.    Appearance: Normal appearance. She is well-developed.  HENT:     Head: Normocephalic and atraumatic.     Right Ear: Hearing normal.     Left Ear: Hearing normal.     Nose: Nose normal.  Eyes:     Conjunctiva/sclera: Conjunctivae normal.     Pupils: Pupils are equal, round, and reactive to light.  Neck:     Musculoskeletal: Normal range of motion and neck supple.  Cardiovascular:     Rate and Rhythm: Regular rhythm.     Heart sounds: S1 normal and S2 normal. No murmur. No friction rub. No gallop.   Pulmonary:     Effort: Pulmonary effort is normal. No respiratory distress.     Breath sounds: Normal breath sounds.  Chest:     Chest wall: No tenderness.  Abdominal:     General: Bowel sounds are  normal.     Palpations: Abdomen is soft.     Tenderness: There is abdominal tenderness in the right upper quadrant and epigastric area. There is no guarding or rebound. Negative signs include Murphy's sign and McBurney's sign.     Hernia: No hernia is present.  Musculoskeletal: Normal range of motion.  Skin:    General: Skin is warm and dry.     Findings: No rash.  Neurological:     Mental Status: She is alert and oriented to person, place, and time.     GCS: GCS eye subscore is 4. GCS verbal subscore is 5. GCS motor subscore is 6.     Cranial Nerves: No cranial nerve deficit.     Sensory: No sensory deficit.     Coordination: Coordination normal.  Psychiatric:        Speech: Speech normal.        Behavior:  Behavior normal.        Thought Content: Thought content normal.      ED Treatments / Results  Labs (all labs ordered are listed, but only abnormal results are displayed) Labs Reviewed  COMPREHENSIVE METABOLIC PANEL - Abnormal; Notable for the following components:      Result Value   Potassium 5.5 (*)    CO2 21 (*)    Glucose, Bld 130 (*)    AST 43 (*)    All other components within normal limits  CBC - Abnormal; Notable for the following components:   WBC 14.9 (*)    All other components within normal limits  LIPASE, BLOOD  URINALYSIS, ROUTINE W REFLEX MICROSCOPIC    EKG EKG Interpretation  Date/Time:  Friday December 29 2018 20:49:13 EDT Ventricular Rate:  71 PR Interval:  158 QRS Duration: 78 QT Interval:  382 QTC Calculation: 415 R Axis:   69 Text Interpretation:  Normal sinus rhythm Inferior infarct , age undetermined Abnormal ECG No previous tracing Confirmed by Gilda CreasePollina, Efrain Clauson J [phone]([id number]) on 12/30/2018 3:24:07 AM   Radiology [place] Abdomen Limited Ruq  Result Date: 12/30/2018 CLINICAL DATA:  Upper abdominal pain, nausea and vomiting. EXAM: ULTRASOUND ABDOMEN LIMITED RIGHT UPPER QUADRANT COMPARISON:  CT AP 07/26/2017 FINDINGS: Gallbladder: No gallstones or wall thickening visualized. No sonographic Murphy sign noted by sonographer. Common bile duct: Diameter: 1.9 mm Liver: No focal lesion identified. Within normal limits in parenchymal echogenicity. Portal vein is patent on color Doppler imaging with normal direction of blood flow towards the liver. Other: None. IMPRESSION: 1. No acute findings. 2. No gallstones identified. Electronically Signed   By: Signa Kellaylor  Stroud M.D.   On: 12/30/2018 04:56    Procedures Procedures (including critical care time)  Medications Ordered in ED Medications  sodium chloride flush (NS) 0.9 % injection 3 mL (3 mLs Intravenous Given 12/29/18 2056)  HYDROmorphone (DILAUDID) injection 0.5 mg (0.5 mg Intravenous Given 12/30/18 0403)    ondansetron (ZOFRAN) injection 4 mg (4 mg Intravenous Given 12/30/18 0403)  sodium chloride 0.9 % bolus 500 mL (0 mLs Intravenous Stopped 12/30/18 0511)     Initial Impression / Assessment and Plan / ED Course  I have reviewed the triage vital signs and the nursing notes.  Pertinent labs & imaging results that were available during my care of the patient were reviewed by me and considered in my medical decision making (see chart for details).        Patient presents to the emergency department for evaluation of abdominal pain.  Patient reports that symptoms began earlier after  eating and have been persistent.  EKG is unremarkable.  Patient has significant epigastric tenderness on exam, no concern for cardiac etiology.  Lab work was normal other than mild leukocytosis.  She does not have any periumbilical or lower abdominal or pelvic tenderness on exam.  Pain was mostly epigastric with mild pain in the right upper quadrant and left upper quadrant.  Ultrasound performed, no acute abnormality noted.  Patient given a single dose of analgesia and has had complete resolution of pain.  She has been monitored for a period of time after analgesia and continues to be pain-free, no tenderness on repeat exam.  Patient will therefore be discharged with symptomatic treatment, return for worsening symptoms otherwise follow-up with primary care.  Final Clinical Impressions(s) / ED Diagnoses   Final diagnoses:  Epigastric pain    ED Discharge Orders         Ordered    pantoprazole (PROTONIX) 40 MG tablet  Daily,   Status:  Discontinued     12/30/18 0556    hyoscyamine (LEVSIN SL) 0.125 MG SL tablet  Every 4 hours PRN,   Status:  Discontinued     12/30/18 0556    hyoscyamine (LEVSIN SL) 0.125 MG SL tablet  Every 4 hours PRN     12/30/18 0557    pantoprazole (PROTONIX) 40 MG tablet  Daily     12/30/18 0557           Gilda Crease, MD 12/30/18 [phone]

## 2019-01-09 DIAGNOSIS — R5382 Chronic fatigue, unspecified: Secondary | ICD-10-CM | POA: Diagnosis not present

## 2019-01-09 DIAGNOSIS — M659 Synovitis and tenosynovitis, unspecified: Secondary | ICD-10-CM | POA: Diagnosis not present

## 2019-01-09 DIAGNOSIS — M159 Polyosteoarthritis, unspecified: Secondary | ICD-10-CM | POA: Diagnosis not present

## 2019-01-09 DIAGNOSIS — K589 Irritable bowel syndrome without diarrhea: Secondary | ICD-10-CM | POA: Diagnosis not present

## 2019-01-23 DIAGNOSIS — R5382 Chronic fatigue, unspecified: Secondary | ICD-10-CM | POA: Diagnosis not present

## 2019-01-23 DIAGNOSIS — Z139 Encounter for screening, unspecified: Secondary | ICD-10-CM | POA: Diagnosis not present

## 2019-01-23 DIAGNOSIS — K589 Irritable bowel syndrome without diarrhea: Secondary | ICD-10-CM | POA: Diagnosis not present

## 2019-01-23 DIAGNOSIS — M159 Polyosteoarthritis, unspecified: Secondary | ICD-10-CM | POA: Diagnosis not present

## 2019-01-23 DIAGNOSIS — G609 Hereditary and idiopathic neuropathy, unspecified: Secondary | ICD-10-CM | POA: Diagnosis not present

## 2019-04-03 DIAGNOSIS — N390 Urinary tract infection, site not specified: Secondary | ICD-10-CM | POA: Diagnosis not present

## 2019-04-03 DIAGNOSIS — M159 Polyosteoarthritis, unspecified: Secondary | ICD-10-CM | POA: Diagnosis not present

## 2019-04-03 DIAGNOSIS — R5382 Chronic fatigue, unspecified: Secondary | ICD-10-CM | POA: Diagnosis not present

## 2019-04-03 DIAGNOSIS — K589 Irritable bowel syndrome without diarrhea: Secondary | ICD-10-CM | POA: Diagnosis not present

## 2019-04-17 DIAGNOSIS — K219 Gastro-esophageal reflux disease without esophagitis: Secondary | ICD-10-CM | POA: Diagnosis not present

## 2019-04-17 DIAGNOSIS — N390 Urinary tract infection, site not specified: Secondary | ICD-10-CM | POA: Diagnosis not present

## 2019-04-17 DIAGNOSIS — G609 Hereditary and idiopathic neuropathy, unspecified: Secondary | ICD-10-CM | POA: Diagnosis not present

## 2019-04-17 DIAGNOSIS — M159 Polyosteoarthritis, unspecified: Secondary | ICD-10-CM | POA: Diagnosis not present

## 2019-05-01 DIAGNOSIS — N390 Urinary tract infection, site not specified: Secondary | ICD-10-CM | POA: Diagnosis not present

## 2019-05-01 DIAGNOSIS — K219 Gastro-esophageal reflux disease without esophagitis: Secondary | ICD-10-CM | POA: Diagnosis not present

## 2019-05-01 DIAGNOSIS — M159 Polyosteoarthritis, unspecified: Secondary | ICD-10-CM | POA: Diagnosis not present

## 2019-05-01 DIAGNOSIS — M25551 Pain in right hip: Secondary | ICD-10-CM | POA: Diagnosis not present

## 2019-05-04 DIAGNOSIS — Z23 Encounter for immunization: Secondary | ICD-10-CM | POA: Diagnosis not present

## 2019-05-15 DIAGNOSIS — M159 Polyosteoarthritis, unspecified: Secondary | ICD-10-CM | POA: Diagnosis not present

## 2019-05-15 DIAGNOSIS — G609 Hereditary and idiopathic neuropathy, unspecified: Secondary | ICD-10-CM | POA: Diagnosis not present

## 2019-05-15 DIAGNOSIS — K589 Irritable bowel syndrome without diarrhea: Secondary | ICD-10-CM | POA: Diagnosis not present

## 2019-05-15 DIAGNOSIS — K219 Gastro-esophageal reflux disease without esophagitis: Secondary | ICD-10-CM | POA: Diagnosis not present

## 2019-05-29 DIAGNOSIS — M25552 Pain in left hip: Secondary | ICD-10-CM | POA: Diagnosis not present

## 2019-05-29 DIAGNOSIS — M159 Polyosteoarthritis, unspecified: Secondary | ICD-10-CM | POA: Diagnosis not present

## 2019-05-29 DIAGNOSIS — K219 Gastro-esophageal reflux disease without esophagitis: Secondary | ICD-10-CM | POA: Diagnosis not present

## 2019-05-29 DIAGNOSIS — G609 Hereditary and idiopathic neuropathy, unspecified: Secondary | ICD-10-CM | POA: Diagnosis not present

## 2019-06-01 DIAGNOSIS — Z23 Encounter for immunization: Secondary | ICD-10-CM | POA: Diagnosis not present

## 2019-06-12 DIAGNOSIS — M159 Polyosteoarthritis, unspecified: Secondary | ICD-10-CM | POA: Diagnosis not present

## 2019-06-12 DIAGNOSIS — M25552 Pain in left hip: Secondary | ICD-10-CM | POA: Diagnosis not present

## 2019-06-12 DIAGNOSIS — K219 Gastro-esophageal reflux disease without esophagitis: Secondary | ICD-10-CM | POA: Diagnosis not present

## 2019-06-12 DIAGNOSIS — G609 Hereditary and idiopathic neuropathy, unspecified: Secondary | ICD-10-CM | POA: Diagnosis not present

## 2019-06-26 DIAGNOSIS — G609 Hereditary and idiopathic neuropathy, unspecified: Secondary | ICD-10-CM | POA: Diagnosis not present

## 2019-06-26 DIAGNOSIS — K589 Irritable bowel syndrome without diarrhea: Secondary | ICD-10-CM | POA: Diagnosis not present

## 2019-06-26 DIAGNOSIS — M159 Polyosteoarthritis, unspecified: Secondary | ICD-10-CM | POA: Diagnosis not present

## 2019-06-26 DIAGNOSIS — K219 Gastro-esophageal reflux disease without esophagitis: Secondary | ICD-10-CM | POA: Diagnosis not present

## 2019-07-30 DIAGNOSIS — M159 Polyosteoarthritis, unspecified: Secondary | ICD-10-CM | POA: Diagnosis not present

## 2019-07-30 DIAGNOSIS — G609 Hereditary and idiopathic neuropathy, unspecified: Secondary | ICD-10-CM | POA: Diagnosis not present

## 2019-07-30 DIAGNOSIS — K219 Gastro-esophageal reflux disease without esophagitis: Secondary | ICD-10-CM | POA: Diagnosis not present

## 2019-07-30 DIAGNOSIS — K589 Irritable bowel syndrome without diarrhea: Secondary | ICD-10-CM | POA: Diagnosis not present

## 2019-08-16 DIAGNOSIS — G609 Hereditary and idiopathic neuropathy, unspecified: Secondary | ICD-10-CM | POA: Diagnosis not present

## 2019-08-16 DIAGNOSIS — R1031 Right lower quadrant pain: Secondary | ICD-10-CM | POA: Diagnosis not present

## 2019-08-16 DIAGNOSIS — M159 Polyosteoarthritis, unspecified: Secondary | ICD-10-CM | POA: Diagnosis not present

## 2019-08-16 DIAGNOSIS — K219 Gastro-esophageal reflux disease without esophagitis: Secondary | ICD-10-CM | POA: Diagnosis not present

## 2019-08-21 DIAGNOSIS — M159 Polyosteoarthritis, unspecified: Secondary | ICD-10-CM | POA: Diagnosis not present

## 2019-08-21 DIAGNOSIS — G609 Hereditary and idiopathic neuropathy, unspecified: Secondary | ICD-10-CM | POA: Diagnosis not present

## 2019-08-21 DIAGNOSIS — R1031 Right lower quadrant pain: Secondary | ICD-10-CM | POA: Diagnosis not present

## 2019-08-21 DIAGNOSIS — K219 Gastro-esophageal reflux disease without esophagitis: Secondary | ICD-10-CM | POA: Diagnosis not present

## 2019-08-24 DIAGNOSIS — R1031 Right lower quadrant pain: Secondary | ICD-10-CM | POA: Diagnosis not present

## 2019-08-24 DIAGNOSIS — R1030 Lower abdominal pain, unspecified: Secondary | ICD-10-CM | POA: Diagnosis not present

## 2019-08-29 DIAGNOSIS — G609 Hereditary and idiopathic neuropathy, unspecified: Secondary | ICD-10-CM | POA: Diagnosis not present

## 2019-08-29 DIAGNOSIS — M159 Polyosteoarthritis, unspecified: Secondary | ICD-10-CM | POA: Diagnosis not present

## 2019-08-29 DIAGNOSIS — K219 Gastro-esophageal reflux disease without esophagitis: Secondary | ICD-10-CM | POA: Diagnosis not present

## 2019-08-29 DIAGNOSIS — R1031 Right lower quadrant pain: Secondary | ICD-10-CM | POA: Diagnosis not present

## 2019-09-28 DIAGNOSIS — G609 Hereditary and idiopathic neuropathy, unspecified: Secondary | ICD-10-CM | POA: Diagnosis not present

## 2019-09-28 DIAGNOSIS — M159 Polyosteoarthritis, unspecified: Secondary | ICD-10-CM | POA: Diagnosis not present

## 2019-09-28 DIAGNOSIS — K219 Gastro-esophageal reflux disease without esophagitis: Secondary | ICD-10-CM | POA: Diagnosis not present

## 2019-09-28 DIAGNOSIS — M25552 Pain in left hip: Secondary | ICD-10-CM | POA: Diagnosis not present

## 2019-10-16 DIAGNOSIS — M25551 Pain in right hip: Secondary | ICD-10-CM | POA: Diagnosis not present

## 2019-10-16 DIAGNOSIS — G609 Hereditary and idiopathic neuropathy, unspecified: Secondary | ICD-10-CM | POA: Diagnosis not present

## 2019-10-16 DIAGNOSIS — K219 Gastro-esophageal reflux disease without esophagitis: Secondary | ICD-10-CM | POA: Diagnosis not present

## 2019-10-16 DIAGNOSIS — M159 Polyosteoarthritis, unspecified: Secondary | ICD-10-CM | POA: Diagnosis not present

## 2019-11-05 DIAGNOSIS — K219 Gastro-esophageal reflux disease without esophagitis: Secondary | ICD-10-CM | POA: Diagnosis not present

## 2019-11-05 DIAGNOSIS — G609 Hereditary and idiopathic neuropathy, unspecified: Secondary | ICD-10-CM | POA: Diagnosis not present

## 2019-11-05 DIAGNOSIS — K589 Irritable bowel syndrome without diarrhea: Secondary | ICD-10-CM | POA: Diagnosis not present

## 2019-11-05 DIAGNOSIS — M159 Polyosteoarthritis, unspecified: Secondary | ICD-10-CM | POA: Diagnosis not present

## 2019-11-18 DIAGNOSIS — N3090 Cystitis, unspecified without hematuria: Secondary | ICD-10-CM | POA: Diagnosis not present

## 2019-11-18 DIAGNOSIS — R350 Frequency of micturition: Secondary | ICD-10-CM | POA: Diagnosis not present

## 2019-11-18 DIAGNOSIS — R309 Painful micturition, unspecified: Secondary | ICD-10-CM | POA: Diagnosis not present

## 2019-11-18 DIAGNOSIS — R300 Dysuria: Secondary | ICD-10-CM | POA: Diagnosis not present

## 2019-11-18 DIAGNOSIS — R3915 Urgency of urination: Secondary | ICD-10-CM | POA: Diagnosis not present

## 2020-12-11 IMAGING — US US ABDOMEN LIMITED
1 series · 14 of 25 positions shown · non-contrast
Comparison: CT AP 07/26/2017

CLINICAL DATA: Upper abdominal pain, nausea and vomiting.

EXAM:
ULTRASOUND ABDOMEN LIMITED RIGHT UPPER QUADRANT

[Series 1: us abdomen limited · 14 of 45 slices shown]
[im 1/45]
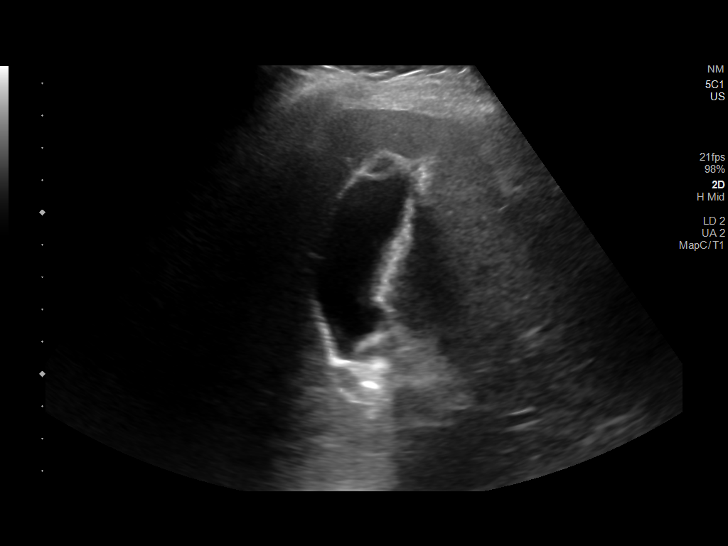
[im 4/45]
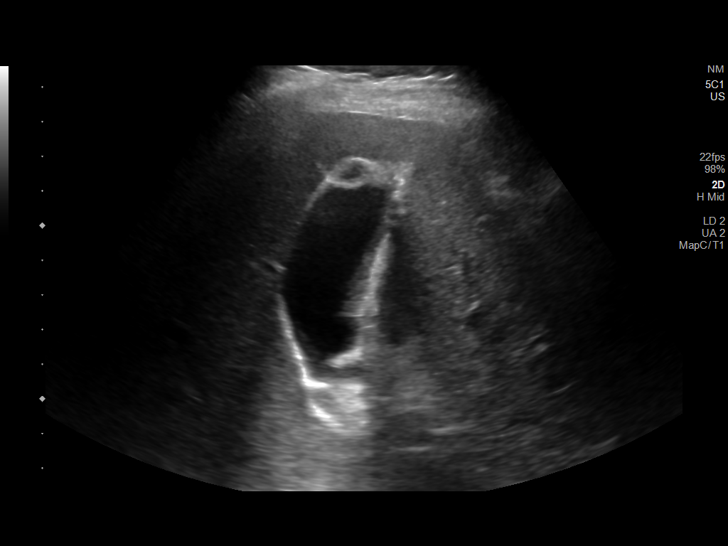
[im 8/45]
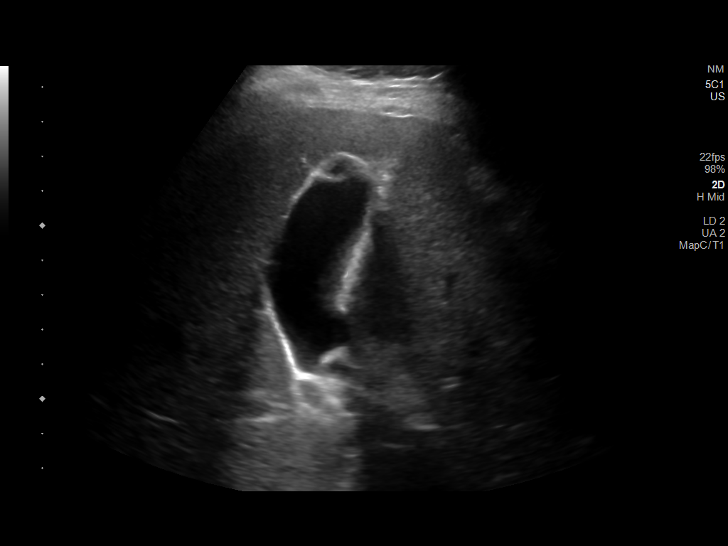
[im 12/45]
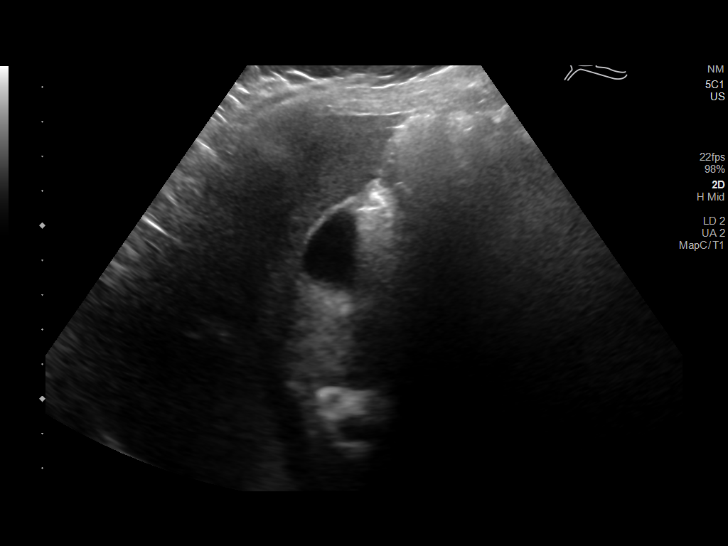
[im 15/45]
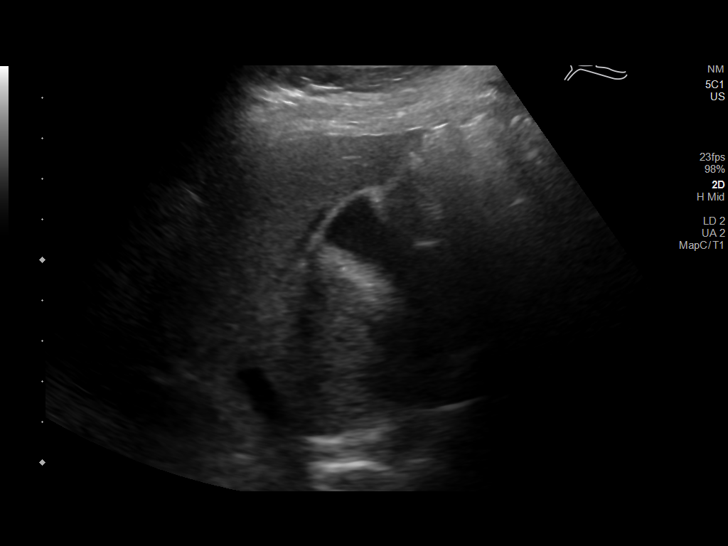
[im 17/45]
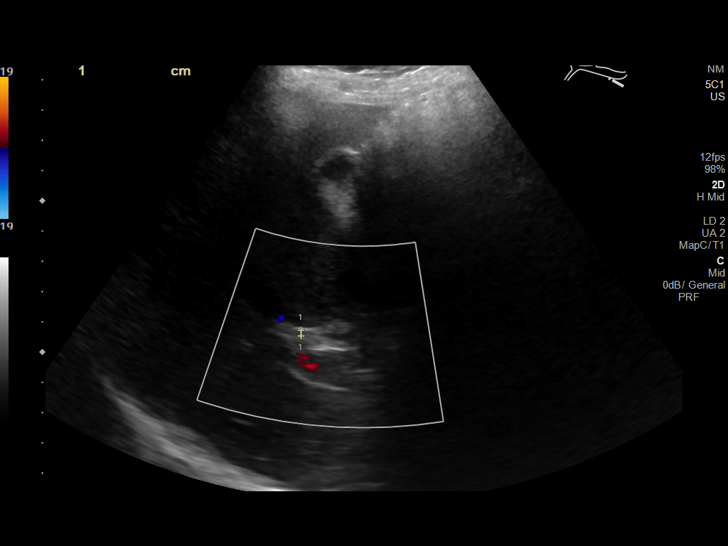
[im 21/45]
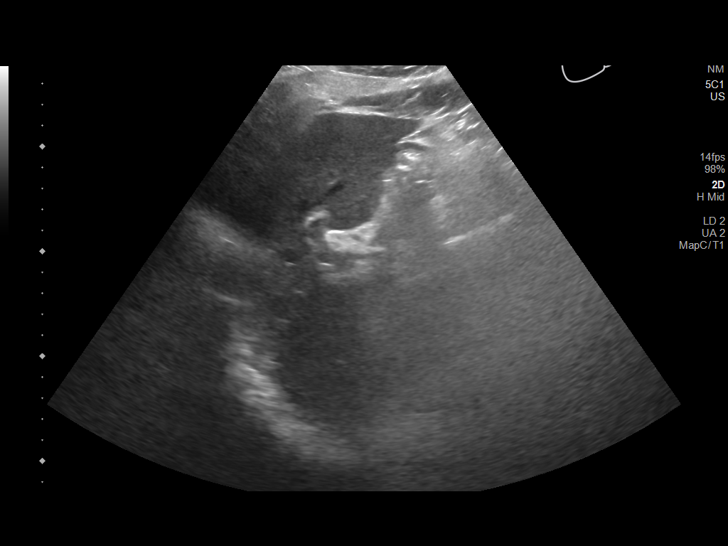
[im 24/45]
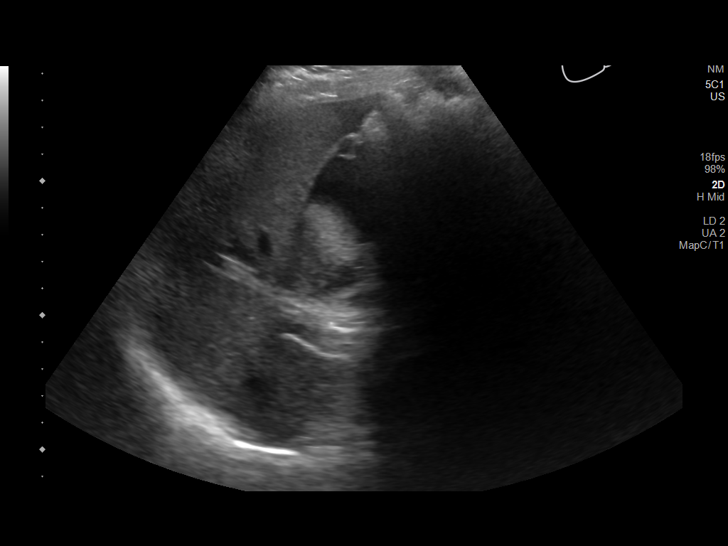
[im 28/45]
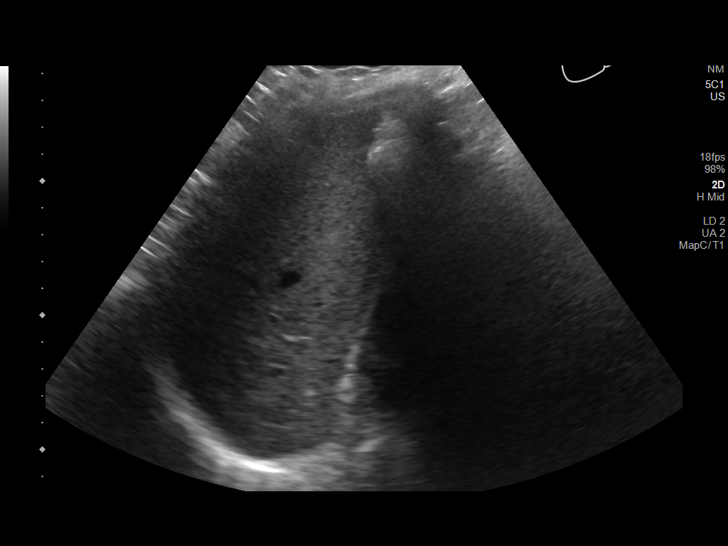
[im 30/45]
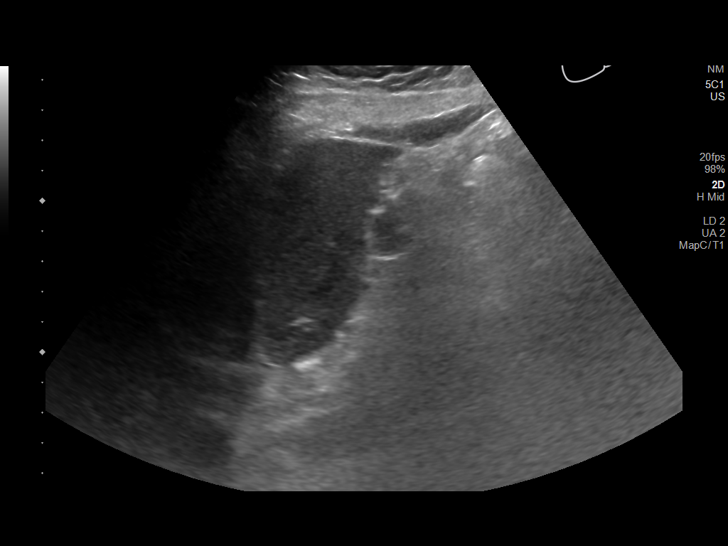
[im 34/45]
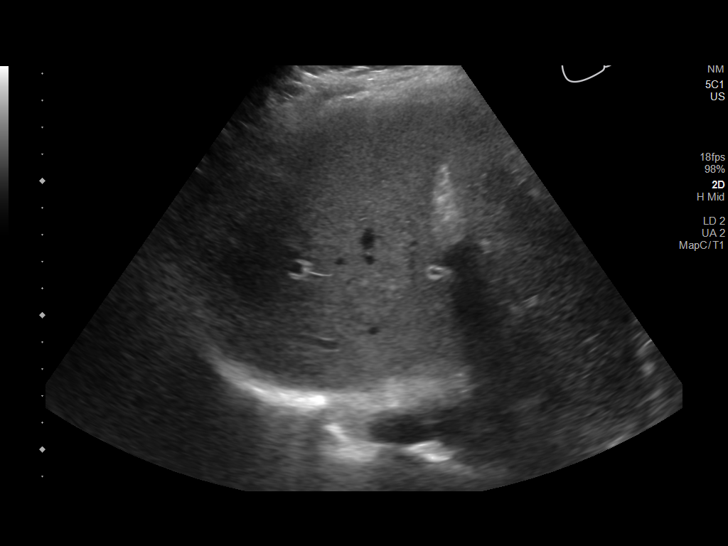
[im 37/45]
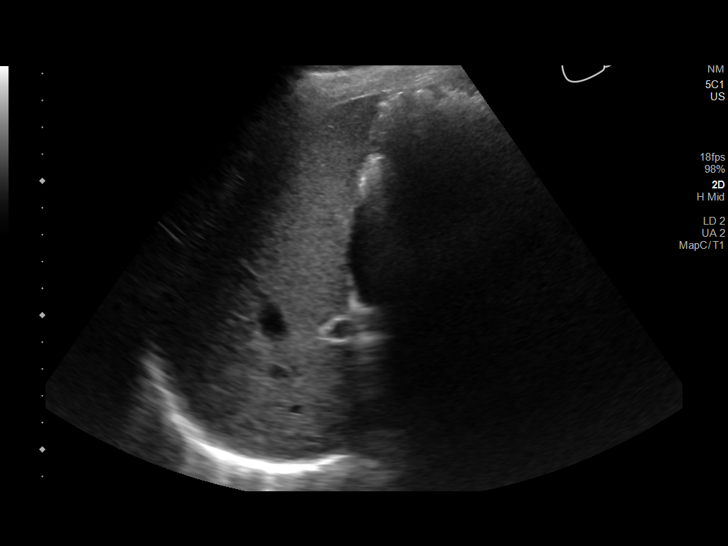
[im 41/45]
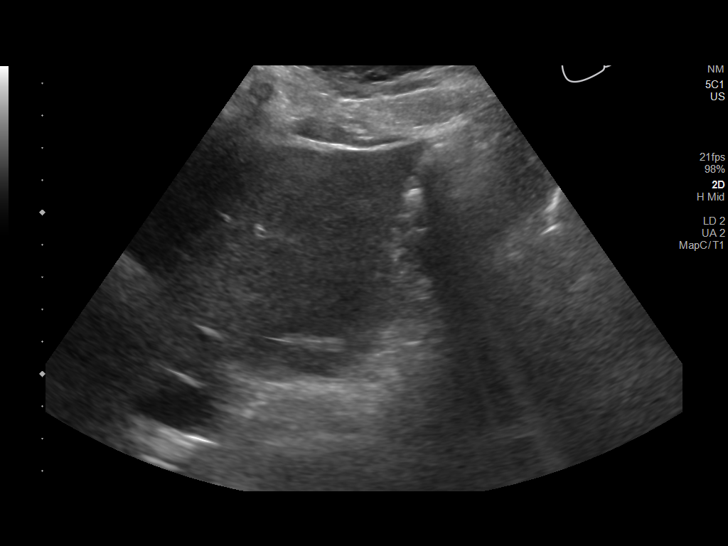
[im 45/45]
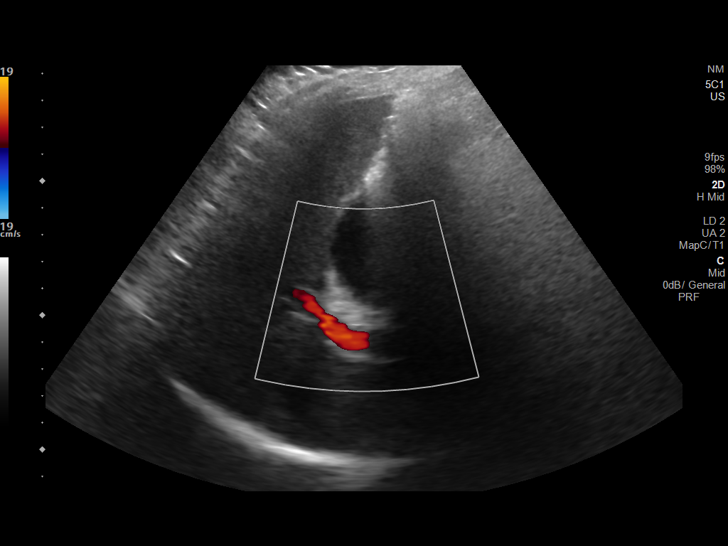

[14 of 25 positions shown; findings below may reference images not displayed]

FINDINGS: Gallbladder:

No gallstones or wall thickening visualized. No sonographic Murphy
sign noted by sonographer.

Common bile duct:

Diameter: 1.9 mm

Liver:

No focal lesion identified. Within normal limits in parenchymal
echogenicity. Portal vein is patent on color Doppler imaging with
normal direction of blood flow towards the liver.

Other: None.
IMPRESSION: 1. No acute findings.
2. No gallstones identified.

## 2023-03-25 ENCOUNTER — Other Ambulatory Visit: Payer: Self-pay

## 2023-03-25 ENCOUNTER — Encounter (HOSPITAL_COMMUNITY): Payer: Self-pay | Admitting: Emergency Medicine

## 2023-03-25 ENCOUNTER — Emergency Department (HOSPITAL_COMMUNITY): Payer: Medicaid Other

## 2023-03-25 ENCOUNTER — Emergency Department (HOSPITAL_COMMUNITY)
Admission: EM | Admit: 2023-03-25 | Discharge: 2023-03-26 | Disposition: A | Discharge status: Home / Self Care | Payer: Medicaid Other | Attending: Emergency Medicine | Admitting: Emergency Medicine

## 2023-03-25 DIAGNOSIS — R112 Nausea with vomiting, unspecified: Secondary | ICD-10-CM | POA: Insufficient documentation

## 2023-03-25 DIAGNOSIS — R197 Diarrhea, unspecified: Secondary | ICD-10-CM | POA: Insufficient documentation

## 2023-03-25 DIAGNOSIS — R1084 Generalized abdominal pain: Secondary | ICD-10-CM | POA: Insufficient documentation

## 2023-03-25 LAB — COMPREHENSIVE METABOLIC PANEL
ALT: 16 U/L (ref 0–44)
AST: 20 U/L (ref 15–41)
Albumin: 3.4 g/dL — ABNORMAL LOW (ref 3.5–5.0)
Alkaline Phosphatase: 43 U/L (ref 38–126)
Anion gap: 12 (ref 5–15)
BUN: 5 mg/dL — ABNORMAL LOW (ref 8–23)
CO2: 24 mmol/L (ref 22–32)
Calcium: 9.3 mg/dL (ref 8.9–10.3)
Chloride: 100 mmol/L (ref 98–111)
Creatinine, Ser: 0.64 mg/dL (ref 0.44–1.00)
GFR, Estimated: >60 mL/min (ref >60)
Glucose, Bld: 117 mg/dL — ABNORMAL HIGH (ref 70–99)
Potassium: 3.6 mmol/L (ref 3.5–5.1)
Sodium: 136 mmol/L (ref 135–145)
Total Bilirubin: 0.5 mg/dL (ref 0.0–1.2)
Total Protein: 6.9 g/dL (ref 6.5–8.1)

## 2023-03-25 LAB — CBC
HCT: 42.7 % (ref 36.0–46.0)
Hemoglobin: 13.9 g/dL (ref 12.0–15.0)
MCH: 28.1 pg (ref 26.0–34.0)
MCHC: 32.6 g/dL (ref 30.0–36.0)
MCV: 86.4 fL (ref 80.0–100.0)
Platelets: 331 10*3/uL (ref 150–400)
RBC: 4.94 MIL/uL (ref 3.87–5.11)
RDW: 13.7 % (ref 11.5–15.5)
WBC: 8.3 10*3/uL (ref 4.0–10.5)
nRBC: 0 % (ref 0.0–0.2)

## 2023-03-25 LAB — LIPASE, BLOOD: Lipase: 29 U/L (ref 11–51)

## 2023-03-25 MED ORDER — IOHEXOL 350 MG/ML SOLN
75.0000 mL | Freq: Once | INTRAVENOUS | Status: AC | PRN
  Administered 2023-03-25: 75 mL via INTRAVENOUS

## 2023-03-25 MED ORDER — ONDANSETRON 4 MG PO TBDP
4.0000 mg | ORAL_TABLET | Freq: Once | ORAL | Status: AC
  Administered 2023-03-25: 4 mg via ORAL

## 2023-03-25 NOTE — ED Provider Triage Note (Signed)
Emergency Medicine Provider Triage Evaluation Note  Malk Busbee , a 73 y.o. female  was evaluated in triage.  Pt complains of several weeks of nausea, vomiting, abdominal pain.  This is associated with generalized weakness and feeling unwell..  Review of Systems  Positive: Abdominal pain, nausea vomiting diarrhea Negative: Chest pain, shortness of breath, cough, fever, chills  Physical Exam  BP 125/87 (BP Location: Right Arm)   Pulse 88   Temp 98.4 F (36.9 C)   Resp 16   Wt 59 kg   SpO2 100%   BMI 24.58 kg/m  Gen:   Awake, no distress   Resp:  Normal effort  MSK:   Moves extremities without difficulty  Other:  Epigastric tenderness to palpation  Medical Decision Making  Medically screening exam initiated at 3:59 PM.  Appropriate orders placed.  Salma Raudenbush was informed that the remainder of the evaluation will be completed by another provider, this initial triage assessment does not replace that evaluation, and the importance of remaining in the ED until their evaluation is complete.  Patient with past medical history of acute infectious diarrhea and diet-controlled diabetes presenting to the emergency room with abdominal pain.  Was seen by primary care who gave antidiarrheals, has not had improvement in symptoms.Smitty Knudsen, PA-C 03/25/23 1601

## 2023-03-25 NOTE — ED Provider Notes (Signed)
[place] EMERGENCY DEPARTMENT AT [facility] Provider Note   CSN: [id number] Arrival date & time: 03/25/23  1358     History  Chief Complaint  Patient presents with   Abdominal Pain   Emesis    Amy Meadows is a 73 y.o. female patient presents with nausea, vomiting, diarrhea, and abdominal pain x 2 weeks.  Patient states that she saw her PCP when this first started and was given pain meds and antidiarrheals but symptoms have persisted.  Patient also endorses fevers, fatigue and chills.  Patient denies blood in stool or emesis.  Patient denies any previous abdominal surgeries.  HPI was gathered using certified language interpreter.   Abdominal Pain Associated symptoms: chills, diarrhea, fatigue, fever, nausea and vomiting   Emesis Associated symptoms: abdominal pain, chills, diarrhea and fever        Home Medications Prior to Admission medications   Medication Sig Start Date End Date Taking? Authorizing Provider  loperamide (IMODIUM) 2 MG capsule Take 4 mg (2 capsules) orally followed by 2 mg (1 capsule) after each unformed stool. Maximum 4 capsules per day. 03/25/23  Yes Dolphus Jenny, PA-C  ondansetron (ZOFRAN) 4 MG tablet Take 1 tablet (4 mg total) by mouth every 8 (eight) hours as needed for up to 5 days for nausea or vomiting. 03/25/23 03/30/23 Yes Dolphus Jenny, PA-C  hyoscyamine (LEVSIN SL) 0.125 MG SL tablet Place 1 tablet (0.125 mg total) under the tongue every 4 (four) hours as needed for cramping (abdominal pain). 12/30/18   Gilda Crease, MD  pantoprazole (PROTONIX) 40 MG tablet Take 1 tablet (40 mg total) by mouth daily. 12/30/18   Gilda Crease, MD  phenazopyridine (PYRIDIUM) 200 MG tablet Take 1 tablet (200 mg total) by mouth 3 (three) times daily. 11/02/17   Elpidio Anis, PA-C  sulfamethoxazole-trimethoprim (BACTRIM DS,SEPTRA DS) 800-160 MG tablet Take 1 tablet by mouth 2 (two) times daily. 07/27/17   Elgergawy, Leana Roe, MD       Allergies    Patient has no known allergies.    Review of Systems   Review of Systems  Constitutional:  Positive for chills, fatigue and fever.  Gastrointestinal:  Positive for abdominal pain, diarrhea, nausea and vomiting.    Physical Exam Updated Vital Signs BP 125/87 (BP Location: Right Arm)   Pulse 88   Temp 98.4 F (36.9 C)   Resp 16   Wt 59 kg   SpO2 100%   BMI 24.58 kg/m  Physical Exam Vitals and nursing note reviewed.  Constitutional:      General: She is not in acute distress.    Appearance: She is well-developed.  HENT:     Head: Normocephalic and atraumatic.  Eyes:     Conjunctiva/sclera: Conjunctivae normal.  Cardiovascular:     Rate and Rhythm: Normal rate and regular rhythm.     Heart sounds: Normal heart sounds. No murmur heard. Pulmonary:     Effort: Pulmonary effort is normal. No respiratory distress.     Breath sounds: Normal breath sounds.  Abdominal:     General: Abdomen is flat. Bowel sounds are normal.     Palpations: Abdomen is soft.     Tenderness: There is generalized abdominal tenderness.  Musculoskeletal:        General: No swelling.     Cervical back: Neck supple.  Skin:    General: Skin is warm and dry.     Capillary Refill: Capillary refill takes less than 2 seconds.  Neurological:     General: No focal deficit present.     Mental Status: She is alert.     Motor: No weakness.  Psychiatric:        Mood and Affect: Mood normal.     ED Results / Procedures / Treatments   Labs (all labs ordered are listed, but only abnormal results are displayed) Labs Reviewed  COMPREHENSIVE METABOLIC PANEL - Abnormal; Notable for the following components:      Result Value   Glucose, Bld 117 (*)    BUN 5 (*)    Albumin 3.4 (*)    All other components within normal limits  LIPASE, BLOOD  CBC  URINALYSIS, ROUTINE W REFLEX MICROSCOPIC    EKG None  Radiology CT ABDOMEN PELVIS W CONTRAST Result Date: 03/25/2023 CLINICAL DATA:   Abdominal pain, nausea, vomiting EXAM: CT ABDOMEN AND PELVIS WITH CONTRAST TECHNIQUE: Multidetector CT imaging of the abdomen and pelvis was performed using the standard protocol following bolus administration of intravenous contrast. RADIATION DOSE REDUCTION: This exam was performed according to the departmental dose-optimization program which includes automated exposure control, adjustment of the mA and/or kV according to patient size and/or use of iterative reconstruction technique. CONTRAST:  75mL OMNIPAQUE IOHEXOL 350 MG/ML SOLN COMPARISON:  None Available. FINDINGS: Lower chest: Dependent atelectasis in the lower lobes. Mild cardiomegaly. Hepatobiliary: No focal hepatic abnormality. Gallbladder unremarkable. Pancreas: No focal abnormality or ductal dilatation. Spleen: No focal abnormality.  Normal size. Adrenals/Urinary Tract: No adrenal abnormality. No focal renal abnormality. No stones or hydronephrosis. Urinary bladder is unremarkable. Stomach/Bowel: Normal appendix. Stomach, large and small bowel grossly unremarkable. Vascular/Lymphatic: Aortoiliac atherosclerosis. No evidence of aneurysm or adenopathy. Reproductive: Uterus and adnexa unremarkable.  No mass. Other: No free fluid or free air. Musculoskeletal: No acute bony abnormality. IMPRESSION: No acute findings in the abdomen or pelvis. Electronically Signed   By: Charlett Nose M.D.   On: 03/25/2023 23:34    Procedures Procedures    Medications Ordered in ED Medications  iohexol (OMNIPAQUE) 350 MG/ML injection 75 mL (75 mLs Intravenous Contrast Given 03/25/23 2306)  ondansetron (ZOFRAN-ODT) disintegrating tablet 4 mg (4 mg Oral Given 03/25/23 2349)    ED Course/ Medical Decision Making/ A&P                                 Medical Decision Making Amount and/or Complexity of Data Reviewed Labs: ordered. Radiology: ordered.   This patient presents to the ED with chief complaint(s) of and/V/D, abdominal pain with pertinent past medical  history of acute infectious diarrhea which further complicates the presenting complaint. The complaint involves an extensive differential diagnosis and also carries with it a high risk of complications and morbidity.    The differential diagnosis includes pancreatitis, choledocholithiasis, cholecystitis, gastritis, colitis, diverticulitis, GI illness  Additional history obtained: Additional history obtained from family Records reviewed Care Everywhere/External Records  ED Course and Reassessment: Patient able to tolerate oral intake without issue prior to discharge  Independent labs interpretation:  The following labs were independently interpreted:  CBC: No notable findings CMP: Decreased BUN at 5, mildly decreased albumin at 3.4 Lipase: 29  Independent visualization of imaging: - I independently visualized the following imaging with scope of interpretation limited to determining acute life threatening conditions related to emergency care: CT abdomen pelvis with contrast, which revealed no acute findings in the abdomen or pelvis  Consultation: - Consulted or discussed management/test interpretation w/ external  professional: None  Consideration for admission or further workup: Considered for admission or further workup however patient's vital signs, physical exam, labs, and imaging were reassuring.  Patient will be given outpatient course of Zofran for nausea and Imodium for diarrhea.  Patient also given gastroenterology contact information if symptoms persist for further evaluation and treatment.        Final Clinical Impression(s) / ED Diagnoses Final diagnoses:  Nausea vomiting and diarrhea  Generalized abdominal pain    Rx / DC Orders ED Discharge Orders          Ordered    ondansetron (ZOFRAN) 4 MG tablet  Every 8 hours PRN        03/26/23 0002    loperamide (IMODIUM) 2 MG capsule        03/26/23 0002              Humaira, Commer, PA-C 03/26/23 0003     Lonell Grandchild, MD 03/29/23 [phone]

## 2023-03-25 NOTE — ED Triage Notes (Signed)
Abd pain with nausea and vomiting x 2 weeks. Pt was seen a few weeks ago by PCP but not getting any better.

## 2023-03-26 MED ORDER — LOPERAMIDE HCL 2 MG PO CAPS: ORAL_CAPSULE | ORAL | 0 refills | Status: AC

## 2023-03-26 MED ORDER — ONDANSETRON HCL 4 MG PO TABS: 4.0000 mg | ORAL_TABLET | Freq: Three times a day (TID) | ORAL | 0 refills | Status: AC | PRN

## 2023-03-26 NOTE — Discharge Instructions (Addendum)
Today you were seen for nausea, vomiting, and diarrhea with abdominal pain.  Please pick up your medications and take as prescribed.  Please follow-up with your primary care physician if your symptoms persist for further evaluation and treatment.  Thank you for letting us treat you today. After reviewing your labs and imaging, I feel you are safe to go home. Please follow up with your PCP in the next several days and provide them with your records from this visit. Return to the Emergency Room if pain becomes severe or symptoms worsen.
